# Patient Record
Sex: Female | Born: 1985 | Race: Black or African American | Hispanic: No | Marital: Single | Smoking: Never smoker
Health system: Southern US, Community
[De-identification: ages and names within clinical notes are randomized; demographics above are authoritative.]

## PROBLEM LIST (undated history)

## (undated) DIAGNOSIS — J45909 Unspecified asthma, uncomplicated: Secondary | ICD-10-CM

## (undated) DIAGNOSIS — D219 Benign neoplasm of connective and other soft tissue, unspecified: Secondary | ICD-10-CM

## (undated) HISTORY — PX: TOOTH EXTRACTION: SUR596

---

## 2009-09-30 ENCOUNTER — Emergency Department (HOSPITAL_COMMUNITY): Admission: EM | Admit: 2009-09-30 | Discharge: 2009-10-01 | Payer: Self-pay | Admitting: Emergency Medicine

## 2009-10-07 ENCOUNTER — Emergency Department (HOSPITAL_COMMUNITY): Admission: EM | Admit: 2009-10-07 | Discharge: 2009-10-07 | Payer: Self-pay | Admitting: Emergency Medicine

## 2010-09-29 ENCOUNTER — Emergency Department (HOSPITAL_COMMUNITY): Admission: EM | Admit: 2010-09-29 | Discharge: 2010-09-29 | Payer: Self-pay | Admitting: Emergency Medicine

## 2011-07-04 ENCOUNTER — Emergency Department (INDEPENDENT_AMBULATORY_CARE_PROVIDER_SITE_OTHER): Payer: No Typology Code available for payment source

## 2011-07-04 ENCOUNTER — Emergency Department (HOSPITAL_BASED_OUTPATIENT_CLINIC_OR_DEPARTMENT_OTHER)
Admission: EM | Admit: 2011-07-04 | Discharge: 2011-07-04 | Disposition: A | Discharge status: Home / Self Care | Payer: No Typology Code available for payment source | Attending: Emergency Medicine | Admitting: Emergency Medicine

## 2011-07-04 DIAGNOSIS — Q740 Other congenital malformations of upper limb(s), including shoulder girdle: Secondary | ICD-10-CM

## 2011-07-04 DIAGNOSIS — S6990XA Unspecified injury of unspecified wrist, hand and finger(s), initial encounter: Secondary | ICD-10-CM | POA: Insufficient documentation

## 2011-07-04 DIAGNOSIS — M549 Dorsalgia, unspecified: Secondary | ICD-10-CM | POA: Insufficient documentation

## 2011-07-04 DIAGNOSIS — T23029A Burn of unspecified degree of unspecified single finger (nail) except thumb, initial encounter: Secondary | ICD-10-CM

## 2011-07-04 DIAGNOSIS — Y92410 Unspecified street and highway as the place of occurrence of the external cause: Secondary | ICD-10-CM | POA: Insufficient documentation

## 2011-07-04 MED ORDER — IBUPROFEN 800 MG PO TABS
800.0000 mg | ORAL_TABLET | Freq: Once | ORAL | Status: AC
  Administered 2011-07-04: 800 mg via ORAL
  Filled 2011-07-04: qty 1

## 2011-07-04 MED ORDER — IBUPROFEN 800 MG PO TABS: 800.0000 mg | ORAL_TABLET | Freq: Three times a day (TID) | ORAL | Status: AC
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
## 2012-08-07 ENCOUNTER — Encounter (HOSPITAL_BASED_OUTPATIENT_CLINIC_OR_DEPARTMENT_OTHER): Payer: Self-pay | Admitting: *Deleted

## 2012-08-07 ENCOUNTER — Emergency Department (HOSPITAL_BASED_OUTPATIENT_CLINIC_OR_DEPARTMENT_OTHER): Payer: Self-pay

## 2012-08-07 ENCOUNTER — Emergency Department (HOSPITAL_BASED_OUTPATIENT_CLINIC_OR_DEPARTMENT_OTHER)
Admission: EM | Admit: 2012-08-07 | Discharge: 2012-08-07 | Disposition: A | Discharge status: Home / Self Care | Payer: Self-pay | Attending: Emergency Medicine | Admitting: Emergency Medicine

## 2012-08-07 DIAGNOSIS — Z79899 Other long term (current) drug therapy: Secondary | ICD-10-CM | POA: Insufficient documentation

## 2012-08-07 DIAGNOSIS — F17200 Nicotine dependence, unspecified, uncomplicated: Secondary | ICD-10-CM | POA: Insufficient documentation

## 2012-08-07 DIAGNOSIS — J45909 Unspecified asthma, uncomplicated: Secondary | ICD-10-CM | POA: Insufficient documentation

## 2012-08-07 DIAGNOSIS — N939 Abnormal uterine and vaginal bleeding, unspecified: Secondary | ICD-10-CM

## 2012-08-07 DIAGNOSIS — N898 Other specified noninflammatory disorders of vagina: Secondary | ICD-10-CM | POA: Insufficient documentation

## 2012-08-07 HISTORY — DX: Unspecified asthma, uncomplicated: J45.909

## 2012-08-07 LAB — URINE MICROSCOPIC-ADD ON

## 2012-08-07 LAB — URINALYSIS, ROUTINE W REFLEX MICROSCOPIC
Bilirubin Urine: NEGATIVE
Protein, ur: NEGATIVE mg/dL
Specific Gravity, Urine: 1.015 (ref 1.005–1.030)
Urobilinogen, UA: 0.2 mg/dL (ref 0.0–1.0)

## 2012-08-07 LAB — WET PREP, GENITAL
Trich, Wet Prep: NONE SEEN
Yeast Wet Prep HPF POC: NONE SEEN

## 2012-08-07 MED ORDER — MEDROXYPROGESTERONE ACETATE 5 MG PO TABS: 10.0000 mg | ORAL_TABLET | Freq: Every day | ORAL | Status: DC

## 2012-08-07 NOTE — ED Notes (Signed)
Attempted I&O x 2 unsuccessful.  Pt ambulatory to BR for urine specimen.  Pelvic cart set up at bedside.

## 2012-08-07 NOTE — ED Notes (Signed)
Patient transported to Ultrasound 

## 2012-08-07 NOTE — ED Notes (Signed)
Patient states she started her menstrual cycle on Jun 26, 2012 and has continued to have heavy bleeding since with large blood clots.  C/O lightheadedness today.

## 2012-08-07 NOTE — ED Provider Notes (Signed)
History     CSN: [id number]  Arrival date & time 08/07/12  1316   First MD Initiated Contact with Patient 08/07/12 1409      Chief Complaint  Patient presents with  . Vaginal Bleeding    (Consider location/radiation/quality/duration/timing/severity/associated sxs/prior treatment) Patient is a 26 y.o. female presenting with vaginal bleeding. The history is provided by the patient. No language interpreter was used.  Vaginal Bleeding This is a new problem. The current episode started today. The problem occurs constantly. The problem has been gradually worsening. Pertinent negatives include no abdominal pain. Nothing aggravates the symptoms. She has tried nothing for the symptoms.  Pt complains of vaginal bleeding for the past 6 weeks.   Pt reports no pain.  Pt does not have a gynecologist  Past Medical History  Diagnosis Date  . Asthma     History reviewed. No pertinent past surgical history.  No family history on file.  History  Substance Use Topics  . Smoking status: Current Some Day Smoker  . Smokeless tobacco: Not on file  . Alcohol Use: Yes    OB History    Grav Para Term Preterm Abortions TAB SAB Ect Mult Living                  Review of Systems  Gastrointestinal: Negative for abdominal pain.  Genitourinary: Positive for vaginal bleeding.  All other systems reviewed and are negative.    Allergies  Review of patient's allergies indicates no known allergies.  Home Medications   Current Outpatient Rx  Name Route Sig Dispense Refill  . DIPHENHYDRAMINE HCL 25 MG PO TABS Oral Take 25 mg by mouth every 6 (six) hours as needed. For allergies.      BP 146/76  Pulse 88  Temp 99 F (37.2 C) (Oral)  Resp 20  Ht 5\' 8"  (1.727 m)  Wt 265 lb (120.203 kg)  BMI 40.29 kg/m2  SpO2 100%  LMP 06/26/2012  Physical Exam  Nursing note and vitals reviewed. Constitutional: She appears well-developed and well-nourished.  HENT:  Head: Normocephalic and atraumatic.    Eyes: Pupils are equal, round, and reactive to light.  Neck: Normal range of motion.  Cardiovascular: Normal rate.   Pulmonary/Chest: Effort normal.  Abdominal: Soft. Bowel sounds are normal.  Genitourinary:       Moderate vaginal bleeding,  cevix and anexa nontender  Musculoskeletal: Normal range of motion.  Neurological: She is alert.  Skin: Skin is warm.    ED Course  Procedures (including critical care time)  Labs Reviewed  URINALYSIS, ROUTINE W REFLEX MICROSCOPIC - Abnormal; Notable for the following:    Hgb urine dipstick LARGE (*)     All other components within normal limits  WET PREP, GENITAL - Abnormal; Notable for the following:    Clue Cells Wet Prep HPF POC RARE (*)     WBC, Wet Prep HPF POC RARE (*)     All other components within normal limits  PREGNANCY, URINE  URINE MICROSCOPIC-ADD ON  GC/CHLAMYDIA PROBE AMP, GENITAL   US Transvaginal Non-ob  08/07/2012  *RADIOLOGY REPORT*  Clinical Data: Vaginal bleeding.  TRANSABDOMINAL AND TRANSVAGINAL ULTRASOUND OF PELVIS Technique:  Both transabdominal and transvaginal ultrasound examinations of the pelvis were performed. Transabdominal technique was performed for global imaging of the pelvis including uterus, ovaries, adnexal regions, and pelvic cul-de-sac.  It was necessary to proceed with endovaginal exam following the transabdominal exam to visualize the endometrium.  Comparison:  None  Findings:  Uterus: A  small fibroid is noted in the fundus measuring 1.5 x 1.3 x 1.4 cm.  Uterus is otherwise within normal limits, measuring 8.1 x 3.9 cm.  Endometrium: Endometrium is thickened, measuring 19 mm.  Right ovary:  The right ovary measures 3.5 x 2.7 x 3.4 cm.  A hypoechoic area within the ovary measures 2.9 cm.  Left ovary: Not identified.  Other findings: Trace free fluid is present.  IMPRESSION:  1.  Endometrial thickening. If bleeding remains unresponsive to hormonal or medical therapy, focal lesion work-up with sonohysterogram  should be considered.  Endometrial biopsy should also be considered in pre-menopausal patients at high risk for endometrial carcinoma. (Ref:  Radiological Reasoning: Algorithmic Workup of Abnormal Vaginal Bleeding with Endovaginal Sonography and Sonohysterography. AJR 2008; 161:W96-04) 2.  2.9 cm cystic area within the right ovary.  This likely represents a dominant follicle. 3.  The left ovary is not visualized.   Original Report Authenticated By: Jamesetta Orleans. MATTERN, M.D.    US Pelvis Complete  08/07/2012  *RADIOLOGY REPORT*  Clinical Data: Vaginal bleeding.  TRANSABDOMINAL AND TRANSVAGINAL ULTRASOUND OF PELVIS Technique:  Both transabdominal and transvaginal ultrasound examinations of the pelvis were performed. Transabdominal technique was performed for global imaging of the pelvis including uterus, ovaries, adnexal regions, and pelvic cul-de-sac.  It was necessary to proceed with endovaginal exam following the transabdominal exam to visualize the endometrium.  Comparison:  None  Findings:  Uterus: A small fibroid is noted in the fundus measuring 1.5 x 1.3 x 1.4 cm.  Uterus is otherwise within normal limits, measuring 8.1 x 3.9 cm.  Endometrium: Endometrium is thickened, measuring 19 mm.  Right ovary:  The right ovary measures 3.5 x 2.7 x 3.4 cm.  A hypoechoic area within the ovary measures 2.9 cm.  Left ovary: Not identified.  Other findings: Trace free fluid is present.  IMPRESSION:  1.  Endometrial thickening. If bleeding remains unresponsive to hormonal or medical therapy, focal lesion work-up with sonohysterogram should be considered.  Endometrial biopsy should also be considered in pre-menopausal patients at high risk for endometrial carcinoma. (Ref:  Radiological Reasoning: Algorithmic Workup of Abnormal Vaginal Bleeding with Endovaginal Sonography and Sonohysterography. AJR 2008; 540:J81-19) 2.  2.9 cm cystic area within the right ovary.  This likely represents a dominant follicle. 3.  The left  ovary is not visualized.   Original Report Authenticated By: Jamesetta Orleans. MATTERN, M.D.      1. Abnormal vaginal bleeding       MDM  I discussed results with pt.  I will treat with provera x 10 days.   Pt advised to call Gyn clinic to schedule appointment for follow up at [phone] or Dr. Guy Franco.          Lonia Skinner [place], [place] 08/07/12 [address] [place], [place] 08/07/12 [phone]

## 2012-08-08 LAB — GC/CHLAMYDIA PROBE AMP, GENITAL: GC Probe Amp, Genital: NEGATIVE

## 2012-08-08 NOTE — ED Provider Notes (Signed)
Medical screening examination/treatment/procedure(s) were performed by non-physician practitioner and as supervising physician I was immediately available for consultation/collaboration.  Anapaola Kinsel, MD 08/08/12 0702 

## 2012-08-15 ENCOUNTER — Encounter (HOSPITAL_COMMUNITY): Payer: Self-pay | Admitting: *Deleted

## 2012-08-15 ENCOUNTER — Inpatient Hospital Stay (HOSPITAL_COMMUNITY)
Admission: AD | Admit: 2012-08-15 | Discharge: 2012-08-16 | Disposition: A | Discharge status: Home / Self Care | Payer: Self-pay | Source: Ambulatory Visit | Attending: Obstetrics & Gynecology | Admitting: Obstetrics & Gynecology

## 2012-08-15 DIAGNOSIS — N949 Unspecified condition associated with female genital organs and menstrual cycle: Secondary | ICD-10-CM | POA: Insufficient documentation

## 2012-08-15 DIAGNOSIS — N898 Other specified noninflammatory disorders of vagina: Secondary | ICD-10-CM

## 2012-08-15 DIAGNOSIS — N939 Abnormal uterine and vaginal bleeding, unspecified: Secondary | ICD-10-CM

## 2012-08-15 DIAGNOSIS — N938 Other specified abnormal uterine and vaginal bleeding: Secondary | ICD-10-CM | POA: Insufficient documentation

## 2012-08-15 DIAGNOSIS — D649 Anemia, unspecified: Secondary | ICD-10-CM | POA: Insufficient documentation

## 2012-08-15 HISTORY — DX: Benign neoplasm of connective and other soft tissue, unspecified: D21.9

## 2012-08-15 LAB — CBC
Hemoglobin: 7.1 g/dL — ABNORMAL LOW (ref 12.0–15.0)
MCH: 22.8 pg — ABNORMAL LOW (ref 26.0–34.0)
MCHC: 30.6 g/dL (ref 30.0–36.0)

## 2012-08-15 MED ORDER — SODIUM CHLORIDE 0.9 % IV BOLUS (SEPSIS)
1000.0000 mL | Freq: Once | INTRAVENOUS | Status: AC
  Administered 2012-08-15: 1000 mL via INTRAVENOUS

## 2012-08-15 MED ORDER — SODIUM CHLORIDE 0.9 % IV SOLN
INTRAVENOUS | Status: AC
  Administered 2012-08-15: 22:00:00 via INTRAVENOUS

## 2012-08-15 MED ORDER — MEGESTROL ACETATE 40 MG PO TABS
40.0000 mg | ORAL_TABLET | Freq: Once | ORAL | Status: AC
  Administered 2012-08-15: 40 mg via ORAL
  Filled 2012-08-15: qty 1

## 2012-08-15 MED ORDER — MEGESTROL ACETATE 20 MG PO TABS: 40.0000 mg | ORAL_TABLET | Freq: Every day | ORAL | Status: DC

## 2012-08-15 MED ORDER — FERROUS SULFATE 325 (65 FE) MG PO TABS: 325.0000 mg | ORAL_TABLET | Freq: Every day | ORAL | Status: DC

## 2012-08-15 NOTE — MAU Note (Signed)
Pt states has been bleeding for 6 weeks; had regular periods until then. Has been feeling weak since last weekend and has had episodes where felt like was going to pass out. States has to change pad every 30 minutes to an hour.

## 2012-08-15 NOTE — MAU Note (Signed)
Pt reports bleeding for 2 months. Pt was seen at [facility] last week. Pt was given a birth control pill and was unable to f/u with gyn due financial barriers. Pt now bleeding again and changing pads every 30-60 minutes. Pt reports feeling weak and lightheaded.

## 2012-08-15 NOTE — MAU Provider Note (Signed)
History     CSN: [id number]  Arrival date and time: 08/15/12 1916   First Provider Initiated Contact with Patient 08/15/12 2112      Chief Complaint  Patient presents with  . Vaginal Bleeding   HPI Katie Bauer is a 26 y.o. female who presents to MAU with vaginal bleeding. The bleeding started end of August. She describes the bleeding as heavier than a period. Using a pad an hour for almost 2 months. Associated symptoms include headache, dizziness and fatigue. Last pap smear > 2 years ago and was normal. No history of STI's. Current sex partner x 6 years and is same sex. Patient states she was evaluated [facility]/17 and started on Provera but has continued to have heavy bleeding. Ultrasound at that time showed thick endometrium. No history of anemia. The history was provided by the patient.  OB History    Grav Para Term Preterm Abortions TAB SAB Ect Mult Living                  Past Medical History  Diagnosis Date  . Asthma   . Fibroid     Past Surgical History  Procedure Date  . Tooth extraction     Family History  Problem Relation Age of Onset  . Other Neg Hx     History  Substance Use Topics  . Smoking status: Current Some Day Smoker  . Smokeless tobacco: Not on file  . Alcohol Use: Yes    Allergies: No Known Allergies  Prescriptions prior to admission  Medication Sig Dispense Refill  . diphenhydrAMINE (SOBA DIPHEDRYL) 25 MG tablet Take 25 mg by mouth every 6 (six) hours as needed. For allergies.      . medroxyPROGESTERone (PROVERA) 5 MG tablet Take 2 tablets (10 mg total) by mouth daily.  10 tablet  0    Review of Systems  Constitutional: Negative for fever, chills and weight loss.  HENT: Negative for ear pain, nosebleeds, congestion, sore throat and neck pain.   Eyes: Negative for blurred vision, double vision, photophobia and pain.  Respiratory: Negative for cough, shortness of breath and wheezing.   Cardiovascular: Negative for chest  pain, palpitations and leg swelling.  Gastrointestinal: Positive for abdominal pain. Negative for heartburn, nausea, vomiting, diarrhea and constipation.  Genitourinary: Negative for dysuria, urgency and frequency.       Vaginal bleeding  Musculoskeletal: Positive for back pain. Negative for myalgias.  Skin: Negative for itching and rash.  Neurological: Positive for dizziness and headaches. Negative for sensory change, speech change, seizures and weakness.  Endo/Heme/Allergies: Does not bruise/bleed easily.  Psychiatric/Behavioral: Negative for depression. The patient is not nervous/anxious and does not have insomnia.    Physical Exam   Blood pressure 130/83, pulse 114, temperature 98.6 F (37 C), temperature source Oral, resp. rate 18, height 5' 7.5" (1.715 m), weight 262 lb 12.8 oz (119.205 kg), last menstrual period 06/26/2012, SpO2 100.00%.  Physical Exam  Nursing note and vitals reviewed. Constitutional: She is oriented to person, place, and time. She appears well-developed and well-nourished. No distress.  HENT:  Head: Normocephalic and atraumatic.  Eyes: EOM are normal.  Neck: Neck supple.  Cardiovascular:       tachycardia  Respiratory: Effort normal.  GI: Soft. Bowel sounds are normal. There is no tenderness. There is no CVA tenderness.  Genitourinary:       External genitalia without lesions. Moderate blood with clots vaginal vault. No CMT, no adnexal tenderness, Uterus  without palpable enlargement.  Musculoskeletal: Normal range of motion.  Neurological: She is alert and oriented to person, place, and time.  Skin: Skin is warm and dry.  Psychiatric: She has a normal mood and affect. Her behavior is normal. Judgment and thought content normal.   Results for orders placed during the hospital encounter of 08/15/12 (from the past 24 hour(s))  CBC     Status: Abnormal   Collection Time   08/15/12  8:44 PM      Component Value Range   WBC 7.6  4.0 - 10.5 K/uL   RBC 3.11 (*)  3.87 - 5.11 MIL/uL   Hemoglobin 7.1 (*) 12.0 - 15.0 g/dL   HCT 16.1 (*) 09.6 - 04.5 %   MCV 74.6 (*) 78.0 - 100.0 fL   MCH 22.8 (*) 26.0 - 34.0 pg   MCHC 30.6  30.0 - 36.0 g/dL   RDW 40.9  81.1 - 91.4 %   Platelets 335  150 - 400 K/uL   MAU Course  Procedures Ultrasound from 08/07/12   TRANSABDOMINAL AND TRANSVAGINAL ULTRASOUND OF PELVIS Technique:  Both transabdominal and transvaginal ultrasound examinations of the pelvis were performed. Transabdominal technique was performed for global imaging of the pelvis including uterus, ovaries, adnexal regions, and pelvic cul-de-sac.   It was necessary to proceed with endovaginal exam following the transabdominal exam to visualize the endometrium.   Comparison:  None   Findings:   Uterus: A small fibroid is noted in the fundus measuring 1.5 x 1.3 x 1.4 cm.  Uterus is otherwise within normal limits, measuring 8.1 x 3.9 cm.   Endometrium: Endometrium is thickened, measuring 19 mm.   Right ovary:  The right ovary measures 3.5 x 2.7 x 3.4 cm.  A hypoechoic area within the ovary measures 2.9 cm.   Left ovary: Not identified.   Other findings: Trace free fluid is present.   IMPRESSION:   1.  Endometrial thickening. If bleeding remains unresponsive to hormonal or medical therapy, focal lesion work-up with sonohysterogram should be considered.  Endometrial biopsy should also be considered in pre-menopausal patients at high risk for endometrial carcinoma. (Ref:  Radiological Reasoning: Algorithmic Workup of Abnormal Vaginal Bleeding with Endovaginal Sonography and Sonohysterography. AJR 2008; 782:N56-21) 2.  2.9 cm cystic area within the right ovary.  This likely represents a dominant follicle. 3.  The left ovary is not visualized.     Original Report Authenticated By: Jamesetta Orleans. MATTERN, M.D.     Re evaluation: patient states she is feeling a lot better after second bag of IV fluids.  Exam: Heart regular rate and rhythm,  lungs clear, abdomen soft, non tender, normal bowel sounds,   Assessment: 26 y.o. female with abnormal vaginal bleeding   Anemia  Plan:  Discussed with Dr. Florentina Addison two tablets (40 mg) three times per day for five days,  then take two tablets (40 mg) two times per day for five days  then take two tablets (40 mg)once per day   Fe   Follow up in GYN Clinic, message sent to clinic to call patient. Discussed with the patient and all questioned fully answered. She will return if any problems arise.  Keisha Amer, RN, FNP, [facility] 08/15/2012, 9:12 PM

## 2012-09-06 ENCOUNTER — Ambulatory Visit (INDEPENDENT_AMBULATORY_CARE_PROVIDER_SITE_OTHER): Payer: Self-pay | Admitting: Obstetrics & Gynecology

## 2012-09-06 VITALS — BP 141/82 | HR 96 | Temp 98.6°F | Resp 12 | Ht 66.0 in | Wt 256.6 lb

## 2012-09-06 DIAGNOSIS — N920 Excessive and frequent menstruation with regular cycle: Secondary | ICD-10-CM

## 2012-09-06 DIAGNOSIS — D649 Anemia, unspecified: Secondary | ICD-10-CM

## 2012-09-06 DIAGNOSIS — N970 Female infertility associated with anovulation: Secondary | ICD-10-CM

## 2012-09-06 LAB — LIPID PANEL
LDL Cholesterol: 73 mg/dL (ref 0–99)
Total CHOL/HDL Ratio: 3.9 Ratio
VLDL: 16 mg/dL (ref 0–40)

## 2012-09-06 LAB — FOLLICLE STIMULATING HORMONE: FSH: 6.6 m[IU]/mL

## 2012-09-06 LAB — LUTEINIZING HORMONE: LH: 1.6 m[IU]/mL

## 2012-09-06 LAB — TSH: TSH: 3.097 u[IU]/mL (ref 0.350–4.500)

## 2012-09-06 MED ORDER — NORETHIN-ETH ESTRADIOL-FE 0.4-35 MG-MCG PO CHEW: 1.0000 | CHEWABLE_TABLET | Freq: Every day | ORAL | Status: DC

## 2012-09-06 NOTE — Progress Notes (Signed)
Subjective:     Patient ID: Katie Bauer, female   DOB: 1986/06/29, 26 y.o.   MRN: [id number]  HPI  Pt with bleeding vaginally for greater than 4 weeks.  Pt was seen in the MAU and given Megace.  She reports that her bleeding has almost decreased since beginning the Megace. Pt has gained a considerably amount of weight.  Eats lots of junk food and does not exercise.  Pt c/o excessive hair growth. Review of Systems     Objective:   Physical ExamBP 141/82  Pulse 96  Temp 98.6 F (37 C) (Oral)  Resp 12  Ht 5\' 6"  (1.676 m)  Wt 256 lb 9.6 oz (116.393 kg)  BMI 41.42 kg/m2  LMP 06/27/2012 HEENT: neck acanthosis nigricans Lungs: CTA CV: RRR Abd: obese; NT; ND; excessive hair on abd GU: EGBUS: no lesions Vagina: no blood in vault Cervix: no lesion; no mucopurulent d/c Uterus: small, mobile Adnexa: no masses; non tender      Recent Results (from the past 1008 hour(s))  URINALYSIS, ROUTINE W REFLEX MICROSCOPIC   Collection Time   08/07/12  2:20 PM      Component Value Range   Color, Urine YELLOW  YELLOW   APPearance CLEAR  CLEAR   Specific Gravity, Urine 1.015  1.005 - 1.030   pH 6.5  5.0 - 8.0   Glucose, UA NEGATIVE  NEGATIVE mg/dL   Hgb urine dipstick LARGE (*) NEGATIVE   Bilirubin Urine NEGATIVE  NEGATIVE   Ketones, ur NEGATIVE  NEGATIVE mg/dL   Protein, ur NEGATIVE  NEGATIVE mg/dL   Urobilinogen, UA 0.2  0.0 - 1.0 mg/dL   Nitrite NEGATIVE  NEGATIVE   Leukocytes, UA NEGATIVE  NEGATIVE  PREGNANCY, URINE   Collection Time   08/07/12  2:20 PM      Component Value Range   Preg Test, Ur NEGATIVE  NEGATIVE  URINE MICROSCOPIC-ADD ON   Collection Time   08/07/12  2:20 PM      Component Value Range   Squamous Epithelial / LPF RARE  RARE   RBC / HPF 11-20  <3 RBC/hpf   Bacteria, UA RARE  RARE  WET PREP, GENITAL   Collection Time   08/07/12  3:02 PM      Component Value Range   Yeast Wet Prep HPF POC NONE SEEN  NONE SEEN   Trich, Wet Prep NONE SEEN  NONE SEEN   Clue Cells  Wet Prep HPF POC RARE (*) NONE SEEN   WBC, Wet Prep HPF POC RARE (*) NONE SEEN  GC/CHLAMYDIA PROBE AMP, GENITAL   Collection Time   08/07/12  3:02 PM      Component Value Range   GC Probe Amp, Genital NEGATIVE  NEGATIVE   Chlamydia, DNA Probe NEGATIVE  NEGATIVE  CBC   Collection Time   08/15/12  8:44 PM      Component Value Range   WBC 7.6  4.0 - 10.5 K/uL   RBC 3.11 (*) 3.87 - 5.11 MIL/uL   Hemoglobin 7.1 (*) 12.0 - 15.0 g/dL   HCT 30.8 (*) 65.7 - 84.6 %   MCV 74.6 (*) 78.0 - 100.0 fL   MCH 22.8 (*) 26.0 - 34.0 pg   MCHC 30.6  30.0 - 36.0 g/dL   RDW 96.2  95.2 - 84.1 %   Platelets 335  150 - 400 K/uL      sono 08/07/12 Findings:  Uterus: A small fibroid is noted in the fundus measuring 1.5 x  1.3  x 1.4 cm. Uterus is otherwise within normal limits, measuring 8.1  x 3.9 cm.  Endometrium: Endometrium is thickened, measuring 19 mm.  Right ovary: The right ovary measures 3.5 x 2.7 x 3.4 cm. A  hypoechoic area within the ovary measures 2.9 cm.  Left ovary: Not identified.  Other findings: Trace free fluid is present.  IMPRESSION:  1. Endometrial thickening. If bleeding remains unresponsive to  hormonal or medical therapy, focal lesion work-up with  sonohysterogram should be considered. Endometrial biopsy should  also be considered in pre-menopausal patients at high risk for  endometrial carcinoma. (Ref: Radiological Reasoning: Algorithmic  Workup of Abnormal Vaginal Bleeding with Endovaginal Sonography and  Sonohysterography. AJR 2008; 161:W96-04)  2. 2.9 cm cystic area within the right ovary. This likely  represents a dominant follicle.  3. The left ovary is not visualized.       Assessment:     Anovulatory cycles- suspect PCOS D/W pt increased risk of DM; increased chol and HTN Obesity- d/w pt diet and exercise and healthier eating.      Plan:     FemCon 1 po q day Labs: hbA1C; CBC; Lipid panel; FSH/LH; TSH F/u 3months Exercise 5x/week for >37min Recommend  Weight Watchers   Kristine Chahal L. Harraway-Smith, M.D., [place]

## 2012-09-06 NOTE — Patient Instructions (Signed)
Polycystic Ovarian Syndrome Polycystic ovarian syndrome is a condition with a number of problems. One problem is with the ovaries. The ovaries are organs located in the female pelvis, on each side of the uterus. Usually, during the menstrual cycle, an egg is released from 1 ovary every month. This is called ovulation. When the egg is fertilized, it goes into the womb (uterus), which allows for the growth of a baby. The egg travels from the ovary through the fallopian tube to the uterus. The ovaries also make the hormones estrogen and progesterone. These hormones help the development of a woman's breasts, body shape, and body hair. They also regulate the menstrual cycle and pregnancy. Sometimes, cysts form in the ovaries. A cyst is a fluid-filled sac. On the ovary, different types of cysts can form. The most common type of ovarian cyst is called a functional or ovulation cyst. It is normal, and often forms during the normal menstrual cycle. Each month, a woman's ovaries grow tiny cysts that hold the eggs. When an egg is fully grown, the sac breaks open. This releases the egg. Then, the sac which released the egg from the ovary dissolves. In one type of functional cyst, called a follicle cyst, the sac does not break open to release the egg. It may actually continue to grow. This type of cyst usually disappears within 1 to 3 months.  One type of cyst problem with the ovaries is called Polycystic Ovarian Syndrome (PCOS). In this condition, many follicle cysts form, but do not rupture and produce an egg. This health problem can affect the following:  Menstrual cycle.  Heart.  Obesity.  Cancer of the uterus.  Fertility.  Blood vessels.  Hair growth (face and body) or baldness.  Hormones.  Appearance.  High blood pressure.  Stroke.  Insulin production.  Inflammation of the liver.  Elevated blood cholesterol and triglycerides. CAUSES   No one knows the exact cause of PCOS.  Women with  PCOS often have a mother or sister with PCOS. There is not yet enough proof to say this is inherited.  Many women with PCOS have a weight problem.  Researchers are looking at the relationship between PCOS and the body's ability to make insulin. Insulin is a hormone that regulates the change of sugar, starches, and other food into energy for the body's use, or for storage. Some women with PCOS make too much insulin. It is possible that the ovaries react by making too many female hormones, called androgens. This can lead to acne, excessive hair growth, weight gain, and ovulation problems.  Too much production of luteinizing hormone (LH) from the pituitary gland in the brain stimulates the ovary to produce too much female hormone (androgen). SYMPTOMS   Infrequent or no menstrual periods, and/or irregular bleeding.  Inability to get pregnant (infertility), because of not ovulating.  Increased growth of hair on the face, chest, stomach, back, thumbs, thighs, or toes.  Acne, oily skin, or dandruff.  Pelvic pain.  Weight gain or obesity, usually carrying extra weight around the waist.  Type 2 diabetes (this is the diabetes that usually does not need insulin).  High cholesterol.  High blood pressure.  Female-pattern baldness or thinning hair.  Patches of thickened and dark brown or black skin on the neck, arms, breasts, or thighs.  Skin tags, or tiny excess flaps of skin, in the armpits or neck area.  Sleep apnea (excessive snoring and breathing stops at times while asleep).  Deepening of the voice.  Gestational diabetes when pregnant.  Increased risk of miscarriage with pregnancy. DIAGNOSIS  There is no single test to diagnose PCOS.   Your caregiver will:  Take a medical history.  Perform a pelvic exam.  Perform an ultrasound.  Check your female and female hormone levels.  Measure glucose or sugar levels in the blood.  Do other blood tests.  If you are producing too many  female hormones, your caregiver will make sure it is from PCOS. At the physical exam, your caregiver will want to evaluate the areas of increased hair growth. Try to allow natural hair growth for a few days before the visit.  During a pelvic exam, the ovaries may be enlarged or swollen by the increased number of small cysts. This can be seen more easily by vaginal ultrasound or screening, to examine the ovaries and lining of the uterus (endometrium) for cysts. The uterine lining may become thicker, if there has not been a regular period. TREATMENT  Because there is no cure for PCOS, it needs to be managed to prevent problems. Treatments are based on your symptoms. Treatment is also based on whether you want to have a baby or whether you need contraception.  Treatment may include:  Progesterone hormone, to start a menstrual period.  Birth control pills, to make you have regular menstrual periods.  Medicines to make you ovulate, if you want to get pregnant.  Medicines to control your insulin.  Medicine to control your blood pressure.  Medicine and diet, to control your high cholesterol and triglycerides in your blood.  Surgery, making small holes in the ovary, to decrease the amount of female hormone production. This is done through a long, lighted tube (laparoscope), placed into the pelvis through a tiny incision in the lower abdomen. Your caregiver will go over some of the choices with you. WOMEN WITH PCOS HAVE THESE CHARACTERISTICS:  High levels of female hormones called androgens.  An irregular or no menstrual cycle.  May have many small cysts in their ovaries. PCOS is the most common hormonal reproductive problem in women of childbearing age. WHY DO WOMEN WITH PCOS HAVE TROUBLE WITH THEIR MENSTRUAL CYCLE? Each month, about 20 eggs start to mature in the ovaries. As one egg grows and matures, the follicle breaks open to release the egg, so it can travel through the fallopian tube for  fertilization. When the single egg leaves the follicle, ovulation takes place. In women with PCOS, the ovary does not make all of the hormones it needs for any of the eggs to fully mature. They may start to grow and accumulate fluid, but no one egg becomes large enough. Instead, some may remain as cysts. Since no egg matures or is released, ovulation does not occur and the hormone progesterone is not made. Without progesterone, a woman's menstrual cycle is irregular or absent. Also, the cysts produce female hormones, which continue to prevent ovulation.  Document Released: 03/03/2005 Document Revised: 01/30/2012 Document Reviewed: 09/25/2009 [facility] Patient Information 2013 [place], [place]. Menorrhagia Dysfunctional uterine bleeding is different from a normal menstrual period. When periods are heavy or there is more bleeding than is usual for you, it is called menorrhagia. It may be caused by hormonal imbalance, or physical, metabolic, or other problems. Examination is necessary in order that your caregiver may treat treatable causes. If this is a continuing problem, a D&C may be needed. That means that the cervix (the opening of the uterus or womb) is dilated (stretched larger) and the lining of  the uterus is scraped out. The tissue scraped out is then examined under a microscope by a specialist (pathologist) to make sure there is nothing of concern that needs further or more extensive treatment. HOME CARE INSTRUCTIONS   If medications were prescribed, take exactly as directed. Do not change or switch medications without consulting your caregiver.  Long term heavy bleeding may result in iron deficiency. Your caregiver may have prescribed iron pills. They help replace the iron your body lost from heavy bleeding. Take exactly as directed. Iron may cause constipation. If this becomes a problem, increase the bran, fruits, and roughage in your diet.  Do not take aspirin or medicines that contain aspirin one  week before or during your menstrual period. Aspirin may make the bleeding worse.  If you need to change your sanitary pad or tampon more than once every 2 hours, stay in bed and rest as much as possible until the bleeding stops.  Eat well-balanced meals. Eat foods high in iron. Examples are leafy green vegetables, meat, liver, eggs, and whole grain breads and cereals. Do not try to lose weight until the abnormal bleeding has stopped and your blood iron level is back to normal. SEEK MEDICAL CARE IF:   You need to change your sanitary pad or tampon more than once an hour.  You develop nausea (feeling sick to your stomach) and vomiting, dizziness, or diarrhea while you are taking your medicine.  You have any problems that may be related to the medicine you are taking. SEEK IMMEDIATE MEDICAL CARE IF:   You have a fever.  You develop chills.  You develop severe bleeding or start to pass blood clots.  You feel dizzy or faint. MAKE SURE YOU:   Understand these instructions.  Will watch your condition.  Will get help right away if you are not doing well or get worse. Document Released: 11/07/2005 Document Revised: 01/30/2012 Document Reviewed: 06/27/2008 [facility] Patient Information 2013 [place], [place].

## 2012-09-07 LAB — CBC
HCT: 32.4 % — ABNORMAL LOW (ref 36.0–46.0)
MCH: 21.7 pg — ABNORMAL LOW (ref 26.0–34.0)
MCHC: 29.3 g/dL — ABNORMAL LOW (ref 30.0–36.0)
MCV: 74.1 fL — ABNORMAL LOW (ref 78.0–100.0)
RDW: 15.9 % — ABNORMAL HIGH (ref 11.5–15.5)

## 2012-09-10 ENCOUNTER — Telehealth: Payer: Self-pay | Admitting: *Deleted

## 2012-09-10 NOTE — Telephone Encounter (Signed)
Message copied by Jill Side on Mon Sep 10, 2012 11:51 AM ------      Message from: Katie Bauer      Created: Fri Sep 07, 2012  1:06 PM       Please call pt and let her know that her Hct is much better but, that she still needs to take the OCP's and get the weight off as we discussed.            Please confirm that pt has started exercise plan and has contacted [employer].            Thx,      clh-S

## 2012-09-10 NOTE — Telephone Encounter (Signed)
Called pt and left message that I am calling with test result and information from Dr. Erin Fulling. Please call back and leave a message of when you may be reached. If you prefer, you may state if it is ok to leave detailed personal medical information on your voice mail.

## 2012-09-11 NOTE — Telephone Encounter (Signed)
Pt returned my call and I informed her of her test results and that Dr. Erin Fulling wants her to continue the OCP's. I also asked if she had contacted [employer] [facility] and started an exercise plan. She stated that [facility] is out of her price range but she had found an app on her phone called "[employer]" which she has been using since her visit. She also states that she goes to the gym several times weekly for atleast 30 min.  I praised pt for following the recommendations made by Dr. Erin Fulling and encouraged her to continue. Pt will call back in December for January 2014 appt as advised.

## 2012-09-24 ENCOUNTER — Telehealth: Payer: Self-pay | Admitting: *Deleted

## 2012-09-26 ENCOUNTER — Telehealth: Payer: Self-pay | Admitting: Medical

## 2012-09-26 ENCOUNTER — Other Ambulatory Visit: Payer: Self-pay | Admitting: Obstetrics and Gynecology

## 2012-09-26 MED ORDER — NORGESTIM-ETH ESTRAD TRIPHASIC 0.18/0.215/0.25 MG-25 MCG PO TABS: 1.0000 | ORAL_TABLET | Freq: Every day | ORAL | Status: DC

## 2012-09-26 NOTE — Telephone Encounter (Signed)
Called pt and pt stated that her BCP that she was prescribed at her last visit is making her sick.  Spoke to [facility] and per Dr. Jolayne Panther she would like for the pt to try taking them at night first and if she continues to not be able to tolerate it then there is a lower dose BCP prescribed for her at her pharmacy.  Pt stated understanding and had no further questions.

## 2012-09-26 NOTE — Telephone Encounter (Signed)
Patient called stating that her medication prescribed at last visit has made her sick. Would like something different called in.

## 2012-10-11 NOTE — Telephone Encounter (Signed)
patient unable to take prescription due to N/V and wants to know if she can have a different RX. Can leave message if she does not answer.

## 2012-10-16 ENCOUNTER — Telehealth: Payer: Self-pay | Admitting: *Deleted

## 2012-10-16 DIAGNOSIS — D649 Anemia, unspecified: Secondary | ICD-10-CM

## 2012-10-16 DIAGNOSIS — N970 Female infertility associated with anovulation: Secondary | ICD-10-CM

## 2012-10-16 MED ORDER — FERROUS SULFATE 325 (65 FE) MG PO TABS: 325.0000 mg | ORAL_TABLET | Freq: Every day | ORAL | Status: DC

## 2012-10-16 MED ORDER — NORETHIN-ETH ESTRAD TRIPHASIC 0.5/0.75/1-35 MG-MCG PO TABS: 1.0000 | ORAL_TABLET | Freq: Every day | ORAL | Status: DC

## 2012-10-16 NOTE — Telephone Encounter (Signed)
Orders sent to [facility] outpatient pharmacy. Patient informed that Rx should be ready later today. Patient had no further questions or concerns.

## 2012-10-16 NOTE — Telephone Encounter (Signed)
Pt left message requesting Rx refill of "the first medicine you gave me- it is working well but I need a generic because it is too expensive."

## 2012-10-16 NOTE — Telephone Encounter (Signed)
Called [facility] outpatient pharmacy to discuss pricing for comparable OCPs. The pharmacist looked into comparable options and returned my call. Her suggestions was Nortrel 0.5-35 for $30/month and a separate iron supplement which would be $5/month. I called the patient to ask her if she thought this would be affordable and she said yes. I will confirm that this change is ok with the provider on call as Dr. Erin Fulling is off until next Tuesday and the patient needs the refill by tomorrow.

## 2012-10-16 NOTE — Telephone Encounter (Signed)
Spoke with patient. She has been taking the [place] and feels as though it is working well for her, however she needs a "generic" because this medication is costing $65/month.

## 2012-10-16 NOTE — Telephone Encounter (Signed)
Discussed patient with Dr. Debroah Loop. He is ok with switching patient to the Nortrel and prescribing an iron supplement. I will send the new Rx to the pharmacy today and inform the patient that it will be available for pickup later today.

## 2013-01-05 ENCOUNTER — Other Ambulatory Visit: Payer: Self-pay

## 2013-09-26 ENCOUNTER — Other Ambulatory Visit: Payer: Self-pay

## 2013-11-16 ENCOUNTER — Emergency Department (HOSPITAL_COMMUNITY)
Admission: EM | Admit: 2013-11-16 | Discharge: 2013-11-17 | Disposition: A | Discharge status: Home / Self Care | Payer: Self-pay | Attending: Emergency Medicine | Admitting: Emergency Medicine

## 2013-11-16 ENCOUNTER — Encounter (HOSPITAL_COMMUNITY): Payer: Self-pay | Admitting: Emergency Medicine

## 2013-11-16 DIAGNOSIS — Z8742 Personal history of other diseases of the female genital tract: Secondary | ICD-10-CM | POA: Insufficient documentation

## 2013-11-16 DIAGNOSIS — J45909 Unspecified asthma, uncomplicated: Secondary | ICD-10-CM | POA: Insufficient documentation

## 2013-11-16 DIAGNOSIS — R6889 Other general symptoms and signs: Secondary | ICD-10-CM

## 2013-11-16 DIAGNOSIS — R079 Chest pain, unspecified: Secondary | ICD-10-CM | POA: Insufficient documentation

## 2013-11-16 DIAGNOSIS — J111 Influenza due to unidentified influenza virus with other respiratory manifestations: Secondary | ICD-10-CM | POA: Insufficient documentation

## 2013-11-16 DIAGNOSIS — Z3202 Encounter for pregnancy test, result negative: Secondary | ICD-10-CM | POA: Insufficient documentation

## 2013-11-16 DIAGNOSIS — F17200 Nicotine dependence, unspecified, uncomplicated: Secondary | ICD-10-CM | POA: Insufficient documentation

## 2013-11-16 LAB — CBC WITH DIFFERENTIAL/PLATELET
Basophils Absolute: 0 10*3/uL (ref 0.0–0.1)
Eosinophils Absolute: 0.2 10*3/uL (ref 0.0–0.7)
HCT: 37.4 % (ref 36.0–46.0)
Lymphs Abs: 1.9 10*3/uL (ref 0.7–4.0)
MCHC: 31.3 g/dL (ref 30.0–36.0)
MCV: 78.1 fL (ref 78.0–100.0)
Monocytes Relative: 11 % (ref 3–12)
Neutro Abs: 0.8 10*3/uL — ABNORMAL LOW (ref 1.7–7.7)
Neutrophils Relative %: 25 % — ABNORMAL LOW (ref 43–77)
Platelets: 295 10*3/uL (ref 150–400)
RDW: 15.1 % (ref 11.5–15.5)
WBC: 3.3 10*3/uL — ABNORMAL LOW (ref 4.0–10.5)

## 2013-11-16 LAB — COMPREHENSIVE METABOLIC PANEL
AST: 26 U/L (ref 0–37)
Albumin: 3.4 g/dL — ABNORMAL LOW (ref 3.5–5.2)
BUN: 5 mg/dL — ABNORMAL LOW (ref 6–23)
Calcium: 9.2 mg/dL (ref 8.4–10.5)
Creatinine, Ser: 0.7 mg/dL (ref 0.50–1.10)
GFR calc Af Amer: >90 mL/min (ref >90)
Glucose, Bld: 100 mg/dL — ABNORMAL HIGH (ref 70–99)
Sodium: 140 mEq/L (ref 135–145)
Total Bilirubin: 0.2 mg/dL — ABNORMAL LOW (ref 0.3–1.2)
Total Protein: 7.3 g/dL (ref 6.0–8.3)

## 2013-11-16 LAB — LIPASE, BLOOD: Lipase: 25 U/L (ref 11–59)

## 2013-11-16 NOTE — ED Notes (Signed)
Pt c/o emesis x 2 days, unable to hold down fluids. +cough and body aches.

## 2013-11-17 ENCOUNTER — Emergency Department (HOSPITAL_COMMUNITY): Payer: Self-pay

## 2013-11-17 LAB — POCT PREGNANCY, URINE: Preg Test, Ur: NEGATIVE

## 2013-11-17 LAB — URINALYSIS, ROUTINE W REFLEX MICROSCOPIC
Bilirubin Urine: NEGATIVE
Ketones, ur: NEGATIVE mg/dL
Leukocytes, UA: NEGATIVE
Nitrite: NEGATIVE
Specific Gravity, Urine: 1.024 (ref 1.005–1.030)
Urobilinogen, UA: 1 mg/dL (ref 0.0–1.0)
pH: 6.5 (ref 5.0–8.0)

## 2013-11-17 MED ORDER — ONDANSETRON HCL 4 MG PO TABS: 4.0000 mg | ORAL_TABLET | Freq: Four times a day (QID) | ORAL | Status: DC | PRN

## 2013-11-17 MED ORDER — SODIUM CHLORIDE 0.9 % IV BOLUS (SEPSIS)
1000.0000 mL | Freq: Once | INTRAVENOUS | Status: AC
  Administered 2013-11-17: 1000 mL via INTRAVENOUS

## 2013-11-17 NOTE — ED Provider Notes (Signed)
CSN: [id number]     Arrival date & time 11/16/13  2111 History   First MD Initiated Contact with Patient 11/17/13 0034     Chief Complaint  Patient presents with  . Emesis   (Consider location/radiation/quality/duration/timing/severity/associated sxs/prior Treatment) HPI Comments: 27 year old female presents with cough and vomiting. She states she's been having cough, congestion, and some aches for the past week. She's been having fevers as well. These are subjective. She has intermittent headaches does not have a headache currently. She's also been nauseous and has not been taking in much fluid because she vomits. She's not have any abdominal pain. She states she did have a little bit of chest pain when she coughs. She states she's coughing up a green and brown sputum. Denies any shortness of breath. She has had multiple coworkers have been sick with similar symptoms. Has had a little bit of a sore throat as well.   Past Medical History  Diagnosis Date  . Asthma   . Fibroid    Past Surgical History  Procedure Laterality Date  . Tooth extraction     Family History  Problem Relation Age of Onset  . Other Neg Hx    History  Substance Use Topics  . Smoking status: Current Some Day Smoker  . Smokeless tobacco: Not on file  . Alcohol Use: Yes     Comment: occ   OB History   Grav Para Term Preterm Abortions TAB SAB Ect Mult Living                 Review of Systems  Constitutional: Positive for fever.  HENT: Positive for congestion and sore throat.   Respiratory: Positive for cough. Negative for shortness of breath.   Cardiovascular: Positive for chest pain.  Gastrointestinal: Positive for nausea and vomiting. Negative for abdominal pain.  Genitourinary: Negative for dysuria and menstrual problem.  Neurological: Positive for headaches. Negative for weakness.  All other systems reviewed and are negative.    Allergies  Review of patient's allergies indicates no known  allergies.  Home Medications  No current outpatient prescriptions on file. BP 137/90  Pulse 100  Temp(Src) 98.6 F (37 C) (Oral)  Resp 18  Wt 219 lb (99.338 kg)  SpO2 100%  LMP 11/08/2013 Physical Exam  Nursing note and vitals reviewed. Constitutional: She is oriented to person, place, and time. She appears well-developed and well-nourished.  HENT:  Head: Normocephalic and atraumatic.  Right Ear: External ear normal.  Left Ear: External ear normal.  Nose: Nose normal.  Mouth/Throat: No oropharyngeal exudate.  Eyes: Right eye exhibits no discharge. Left eye exhibits no discharge.  Neck: Normal range of motion. Neck supple.  Cardiovascular: Normal rate, regular rhythm and normal heart sounds.   Pulmonary/Chest: Effort normal and breath sounds normal. She has no wheezes. She has no rales.  Abdominal: Soft. She exhibits no distension. There is no tenderness.  Lymphadenopathy:    She has no cervical adenopathy.  Neurological: She is alert and oriented to person, place, and time.  Skin: Skin is warm and dry.    ED Course  Procedures (including critical care time) Labs Review Labs Reviewed  CBC WITH DIFFERENTIAL - Abnormal; Notable for the following:    WBC 3.3 (*)    Hemoglobin 11.7 (*)    MCH 24.4 (*)    Neutrophils Relative % 25 (*)    Lymphocytes Relative 57 (*)    Eosinophils Relative 6 (*)    Neutro Abs 0.8 (*)  All other components within normal limits  COMPREHENSIVE METABOLIC PANEL - Abnormal; Notable for the following:    Glucose, Bld 100 (*)    BUN 5 (*)    Albumin 3.4 (*)    Total Bilirubin 0.2 (*)    All other components within normal limits  LIPASE, BLOOD  URINALYSIS, ROUTINE W REFLEX MICROSCOPIC  PATHOLOGIST SMEAR REVIEW   Imaging Review Dg Chest 2 View  11/17/2013   CLINICAL DATA:  Cough, chest pain  EXAM: CHEST  2 VIEW  COMPARISON:  None.  FINDINGS: The heart size and mediastinal contours are within normal limits. Both lungs are clear. The  visualized skeletal structures are unremarkable.  IMPRESSION: No active cardiopulmonary disease.   Electronically Signed   By: Ruel Favors M.D.   On: 11/17/2013 01:12    EKG Interpretation    Date/Time:  Sunday November 17 2013 02:11:29 EST Ventricular Rate:  62 PR Interval:  156 QRS Duration: 91 QT Interval:  406 QTC Calculation: 412 R Axis:   74 Text Interpretation:  Sinus rhythm Normal ECG No significant change since last tracing Confirmed by Diandre Merica  MD, Dacian Orrico ([id number]) on 11/17/2013 9:13:54 AM            MDM   1. Flu-like symptoms    Patient with flu-like symptoms with vomiting. No vomiting here. Able to tolerate PO after given zofran. No signs of PNA. No tachypnea or respiratory distress. CP likely from coughing, has benign ECG and has viral symptoms. Will d/c with zofran, discussed supportive care and return precautions.     Audree Camel, MD 11/17/13 [phone]

## 2013-11-17 NOTE — ED Notes (Signed)
Pt stated that she is not able to urinate.   Will try again in 30 minutes. 

## 2013-11-17 NOTE — ED Notes (Signed)
Patient transported to X-ray 

## 2013-11-18 LAB — PATHOLOGIST SMEAR REVIEW

## 2015-07-08 ENCOUNTER — Emergency Department (HOSPITAL_BASED_OUTPATIENT_CLINIC_OR_DEPARTMENT_OTHER): Payer: Self-pay

## 2015-07-08 ENCOUNTER — Encounter (HOSPITAL_BASED_OUTPATIENT_CLINIC_OR_DEPARTMENT_OTHER): Payer: Self-pay | Admitting: Emergency Medicine

## 2015-07-08 ENCOUNTER — Emergency Department (HOSPITAL_BASED_OUTPATIENT_CLINIC_OR_DEPARTMENT_OTHER)
Admission: EM | Admit: 2015-07-08 | Discharge: 2015-07-08 | Disposition: A | Discharge status: Home / Self Care | Payer: Self-pay | Attending: Emergency Medicine | Admitting: Emergency Medicine

## 2015-07-08 DIAGNOSIS — Z86018 Personal history of other benign neoplasm: Secondary | ICD-10-CM | POA: Insufficient documentation

## 2015-07-08 DIAGNOSIS — J45909 Unspecified asthma, uncomplicated: Secondary | ICD-10-CM | POA: Insufficient documentation

## 2015-07-08 DIAGNOSIS — R0789 Other chest pain: Secondary | ICD-10-CM | POA: Insufficient documentation

## 2015-07-08 MED ORDER — METHOCARBAMOL 500 MG PO TABS: 500.0000 mg | ORAL_TABLET | Freq: Two times a day (BID) | ORAL | Status: DC

## 2015-07-08 MED ORDER — NAPROXEN 500 MG PO TABS: 500.0000 mg | ORAL_TABLET | Freq: Two times a day (BID) | ORAL | Status: DC

## 2015-07-08 MED ORDER — CYCLOBENZAPRINE HCL 10 MG PO TABS
5.0000 mg | ORAL_TABLET | Freq: Once | ORAL | Status: AC
  Administered 2015-07-08: 5 mg via ORAL
  Filled 2015-07-08: qty 1

## 2015-07-08 MED ORDER — NAPROXEN 250 MG PO TABS
500.0000 mg | ORAL_TABLET | Freq: Once | ORAL | Status: AC
  Administered 2015-07-08: 500 mg via ORAL
  Filled 2015-07-08: qty 2

## 2015-07-08 NOTE — Discharge Instructions (Signed)
Chest Wall Pain Take naproxen for pain. Chest wall pain is pain in or around the bones and muscles of your chest. It may take up to 6 weeks to get better. It may take longer if you must stay physically active in your work and activities.  CAUSES  Chest wall pain may happen on its own. However, it may be caused by:  A viral illness like the flu.  Injury.  Coughing.  Exercise.  Arthritis.  Fibromyalgia.  Shingles. HOME CARE INSTRUCTIONS   Avoid overtiring physical activity. Try not to strain or perform activities that cause pain. This includes any activities using your chest or your abdominal and side muscles, especially if heavy weights are used.  Put ice on the sore area.  Put ice in a plastic bag.  Place a towel between your skin and the bag.  Leave the ice on for 15-20 minutes per hour while awake for the first 2 days.  Only take over-the-counter or prescription medicines for pain, discomfort, or fever as directed by your caregiver. SEEK IMMEDIATE MEDICAL CARE IF:   Your pain increases, or you are very uncomfortable.  You have a fever.  Your chest pain becomes worse.  You have new, unexplained symptoms.  You have nausea or vomiting.  You feel sweaty or lightheaded.  You have a cough with phlegm (sputum), or you cough up blood. MAKE SURE YOU:   Understand these instructions.  Will watch your condition.  Will get help right away if you are not doing well or get worse. Document Released: 11/07/2005 Document Revised: 01/30/2012 Document Reviewed: 07/04/2011 [facility] Patient Information 2015 [place], [place]. This information is not intended to replace advice given to you by your health care provider. Make sure you discuss any questions you have with your health care provider.  Emergency Department Resource Guide 1) Find a Doctor and Pay Out of Pocket Although you won't have to find out who is covered by your insurance plan, it is a good idea to ask around and  get recommendations. You will then need to call the office and see if the doctor you have chosen will accept you as a new patient and what types of options they offer for patients who are self-pay. Some doctors offer discounts or will set up payment plans for their patients who do not have insurance, but you will need to ask so you aren't surprised when you get to your appointment.  2) Contact Your Local Health Department Not all health departments have doctors that can see patients for sick visits, but many do, so it is worth a call to see if yours does. If you don't know where your local health department is, you can check in your phone book. The CDC also has a tool to help you locate your state's health department, and many state websites also have listings of all of their local health departments.  3) Find a [facility] If your illness is not likely to be very severe or complicated, you may want to try a walk in clinic. These are popping up all over the country in pharmacies, drugstores, and shopping centers. They're usually staffed by nurse practitioners or physician assistants that have been trained to treat common illnesses and complaints. They're usually fairly quick and inexpensive. However, if you have serious medical issues or chronic medical problems, these are probably not your best option.  No Primary Care Doctor: - Call Health Connect at  [phone] - they can help you locate a primary  care doctor that  accepts your insurance, provides certain services, etc. - Physician Referral Service- [phone]  Chronic Pain Problems: Organization         Address  Phone   Notes  [facility]  [phone] Patients need to be referred by their primary care doctor.   Medication Assistance: Organization         Address  Phone   Notes  [facility] Medication [facility] [address] [place], [place] [address] [phone] --Must be a resident of  [facility] -- Must have NO insurance coverage whatsoever (no Medicaid/ Medicare, etc.) -- The pt. MUST have a primary care doctor that directs their care regularly and follows them in the community   [facility]ist  [phone]   [employer]  [phone]    Agencies that provide inexpensive medical care: Organization         Address  Phone   Notes  [place]  [phone]   [facility]    [phone]   [facility] [place], [place] [address] [phone]   [place] [address], [place] [phone]   Planned Parenthood    [phone]   [facility]    [phone]   [place] and [place] [address], [place] Phone:  [phone], Fax:  [phone] Hours of Operation:  9 am - 6 pm, M-F.  Also accepts Medicaid/Medicare and self-pay.  [facility] for [place] [address], [address], [place] Phone: [phone], Fax: [phone]. Hours of Operation:  8:30 am - 5:30 pm, M-F.  Also accepts Medicaid and self-pay.  [facility] [place] [address] [place] Phone: [phone]   [place], [place], [place] [phone], Ext. [phone] Mondays & Thursdays: 7-9 AM.  First 15 patients are seen on a first come, first serve basis.    [place] Providers:  Organization         Address  Phone   Notes  [facility] [address], [place] [phone] Also accepts self-pay patients.  [facility] [address], [place]  [phone]   [place], [address], [place] [phone]   [facility] [address] [place] [phone]   Lucianne Lei [address], [place]   [phone] Only accepts [place] Access  [place] patients after they have their name applied to their card.   Self-Pay (no insurance) in [facility]:  Organization         Address  Phone   Notes  Sickle Cell Patients, [facility] Internal Medicine [place] [phone]   [facility] Urgent Care [place] [phone]   [facility] [place]  [place], [place], [place] [phone]   [facility]/Dr. Osei-Bonsu  [address] [place] or [address], [place] [phone] Phone number for both [place] and [place] locations is the same.  Urgent Medical and [facility] [address], [place] [phone]   [facility] [place] [address], [place] or [address] [phone]  [facility] [place] [phone], phone; [phone], fax Sees patients 1st and 3rd Saturday of every month.  Must not qualify for public or private insurance (i.e. Medicaid, Medicare, [place] Health Choice, Veterans' Benefits)  Household income should be no more than 200% of the poverty level The clinic cannot treat you if you are pregnant or think you are pregnant  Sexually transmitted diseases are not treated at the clinic.    Dental Care: Organization         Address  Phone  Notes  [facility] Department of [facility] [place] [phone] Accepts children up to age 55 who are enrolled in [place] or [place]; pregnant women with a Medicaid card; and children who have applied for Medicaid or [place] Health Choice, but were declined, whose parents can pay a reduced fee at time of service.  [facility] Department of [facility]  [address], [place] [phone] Accepts children up to age 77 who are enrolled in [place] or [place]; pregnant women with a Medicaid  card; and children who have applied for Medicaid or [place] Health Choice, but were declined, whose parents can pay a reduced fee at time of service.  [place] Adult Dental Access PROGRAM  [place] [phone] Patients are seen by appointment only. Walk-ins are not accepted. [place] will see patients 72 years of age and older. Monday - Tuesday (8am-5pm) Most Wednesdays (8:30-5pm) $30 per visit, cash only  [facility] Adult Dental Access PROGRAM  [address], [facility] [phone] Patients are seen by appointment only. Walk-ins are not accepted. [place] will see patients 58 years of age and older. One Wednesday Evening (Monthly: Volunteer Based).  $30 per visit, cash only  [place]  [phone] for adults; Children under age 38, call Graduate Pediatric Dentistry at [phone]. Children aged 60-14, please call [phone] to request a pediatric application.  Dental services are provided in all areas of dental care including fillings, crowns and bridges, complete and partial dentures, implants, gum treatment, root canals, and extractions. Preventive care is also provided. Treatment is provided to both adults and children. Patients are selected via a lottery and there is often a waiting list.   [facility] [address], [place]  [phone] [email or web address]   [facility] [address] [place], [place] [phone], Ext. [phone] Second and Fourth Thursday of each month, opens at 6:30 AM; Clinic ends at 9 AM.  Patients are seen on a first-come first-served basis, and a limited number are seen during each clinic.   [facility]  [address] Hillard Danker [place], [place] [phone]   Eligibility Requirements You must have lived in [place], [place], or [place] counties for at least the last three months.   You cannot be eligible for state or federal sponsored [employer],  including [employer], [place], or [employer].   You generally cannot be eligible for healthcare insurance through your employer.    How to apply: Eligibility screenings are held every Tuesday and Wednesday afternoon from 1:00 pm until 4:00 pm. You do not need an appointment for the interview!  [facility] [address] [place], [place]   [place]  [place] Department  [place]  Department  [phone]    Behavioral Health Resources in the Community: Intensive Outpatient Programs Organization         Address  Phone  Notes  [place]. [address], [place], [place] [phone]   [facility] [address], [place], [place]   ADS: Alcohol & Drug Svcs [address], [place], [place]   [place] [address],  [place], [place] or [phone]   Substance Abuse Resources Organization         Address  Phone  Notes  Alcohol and Drug Services  [phone]   [place]  [phone]   [facility] [place]   Chinita Pester  [phone]   Residential & Outpatient Substance Abuse Program  [phone]   Psychological Services Organization         Address  Phone  Notes  [facility]  [place]  [phone]   [place] [address], [place] or [phone]    Mobile Crisis Teams Organization         Address  Phone  Notes  [facility], Mobile Crisis Care Unit  [phone]   [facility]  [address] [place], [place]   [place] [address] [place] [phone]    Self-Help/Support Groups Organization         Address  Phone             Notes  [place].  of [place] - variety of support groups  [place] Call for more information  Narcotics Anonymous (NA), [facility]aring [facility] [address], [employer] [place]  2 meetings at this location   Special educational needs teacher         Address  Phone  Notes  [facility] Residential Treatment [place],    [place]  [phone]   [facility]  [address] [place] [address], [place], [place]   [place], [place] [phone] Admissions: 8am-3pm M-F  Incentives Substance [place] [address]    [place], [place] [phone]   [facility] [address] [place], [place], [place]   [facility] [address],  [place], [place]   [facility]ight Programs - Intensive Outpatient [address], [place], [place]   [facility] ([place] [address] [place].,  [place], [place] [phone] or [phone]   [facility]idential Treatment Services (RTS) [address] [place], [place] Accepts Medicaid  [facility] [place] [address],  [place] [phone] Substance Abuse/Addiction Treatment   [facility] Organization         Address  Phone  Notes  [facility]  [phone]   Domenic Schwab, PhD [address] Arlis Porta [place], [place]   [phone] or [phone]   [place]aves [place], [place] [phone]   [place] [address] [place], [place] [phone] Insurance/Medicaid/sponsorship through [employer] and Families [address] [place]                                    [place], [place] [phone] [place]  [place], [place] [phone]    Dr. Adele Schilder  [phone]   [facility] Dept. 1) [address] [place] 2)  [place] Community 3)  [address], [place] [phone]  [phone]   [place] [phone] or [phone] (After Hours)

## 2015-07-08 NOTE — ED Notes (Signed)
Pt given 2 rx for Robaxin and Naproxen.

## 2015-07-08 NOTE — ED Notes (Signed)
Pt complains of right sided pain with pain. Lifted something two weeks ago and thinks bruise.

## 2015-07-08 NOTE — ED Provider Notes (Signed)
CSN: [id number]     Arrival date & time 07/08/15  1539 History   First MD Initiated Contact with Patient 07/08/15 1556     Chief Complaint  Patient presents with  . right sided pain      (Consider location/radiation/quality/duration/timing/severity/associated sxs/prior Treatment) The history is provided by the patient. No language interpreter was used.  Ms. Bubeck is a 29 y.o female with a history of asthma who presents for right-sided rib pain after lifting and pulling while working at [employer]. She states it has worsened over the past week with movement including walking, breathing, changing positions. She is taken ibuprofen 3 times for pain with minimal improvement. She denies any injury or fall.  Past Medical History  Diagnosis Date  . Asthma   . Fibroid    Past Surgical History  Procedure Laterality Date  . Tooth extraction     Family History  Problem Relation Age of Onset  . Other Neg Hx    Social History  Substance Use Topics  . Smoking status: Never Smoker   . Smokeless tobacco: None  . Alcohol Use: Yes     Comment: occ   OB History    No data available     Review of Systems  Constitutional: Negative for fever.  Respiratory: Negative for shortness of breath.   Gastrointestinal: Negative for nausea, vomiting and abdominal pain.      Allergies  Review of patient's allergies indicates no known allergies.  Home Medications   Prior to Admission medications   Medication Sig Start Date End Date Taking? Authorizing Provider  methocarbamol (ROBAXIN) 500 MG tablet Take 1 tablet (500 mg total) by mouth 2 (two) times daily. 07/08/15   Chastidy Ranker Patel-Mills, PA-C  naproxen (NAPROSYN) 500 MG tablet Take 1 tablet (500 mg total) by mouth 2 (two) times daily. 07/08/15   Adam Demary Patel-Mills, PA-C  ondansetron (ZOFRAN) 4 MG tablet Take 1 tablet (4 mg total) by mouth every 6 (six) hours as needed for nausea or vomiting. 11/17/13   Sherwood Gambler, MD   BP 136/71 mmHg  Pulse 83   Temp(Src) 97.7 F (36.5 C) (Oral)  Resp 16  Ht 5\' 6"  (1.676 m)  Wt 255 lb 12.8 oz (116.03 kg)  BMI 41.31 kg/m2  SpO2 100%  LMP 06/14/2015 Physical Exam  Constitutional: She is oriented to person, place, and time. She appears well-developed and well-nourished.  HENT:  Head: Normocephalic and atraumatic.  Eyes: Conjunctivae are normal.  Neck: Normal range of motion.  Cardiovascular: Normal rate, regular rhythm and normal heart sounds.   Pulmonary/Chest: Effort normal and breath sounds normal. No respiratory distress. She has no wheezes. She has no rales. She exhibits tenderness. She exhibits no crepitus, no deformity and no swelling.    Clear to auscultation bilaterally.  No decreased breath sounds or wheezing. No difficulty breathing or tripoding.  Abdominal: Soft. She exhibits no distension. There is no tenderness. There is no rebound and no guarding.  No CVA tenderness.  Musculoskeletal: Normal range of motion.  Neurological: She is alert and oriented to person, place, and time.  Skin: Skin is warm and dry.  Psychiatric: She has a normal mood and affect. Her behavior is normal.  Nursing note and vitals reviewed.   ED Course  Procedures (including critical care time) Labs Review Labs Reviewed - No data to display  Imaging Review Dg Ribs Unilateral W/chest Right  07/08/2015   CLINICAL DATA:  Right anterior rib pain for 2 weeks.  EXAM: RIGHT RIBS AND  CHEST - 3+ VIEW  COMPARISON:  11/17/2013  FINDINGS: No fracture or other bone lesions are seen involving the ribs. There is no evidence of pneumothorax or pleural effusion. Both lungs are clear. Heart size and mediastinal contours are within normal limits.  IMPRESSION: Negative.   Electronically Signed   By: Rolm Baptise M.D.   On: 07/08/2015 16:27   I have personally reviewed and evaluated these images and lab results as part of my medical decision-making.   EKG Interpretation None      MDM   Final diagnoses:  Chest wall  pain   Vitals stable.  100 oxygen on RA. Well appearing and in no acute distress. Xray of right ribs shows no acute fracture or dislocation, or pneumothorax. This is mostly likely chest wall pain/strain from lifting.  I prescribed her naproxen and robaxin.  Patient verbally agrees with the plan.  Medications  naproxen (NAPROSYN) tablet 500 mg (500 mg Oral Given 07/08/15 1659)  cyclobenzaprine (FLEXERIL) tablet 5 mg (5 mg Oral Given 07/08/15 1700)      Ottie Glazier, PA-C 07/08/15 1703  Davonna Belling, MD 07/08/15 2322

## 2017-06-05 ENCOUNTER — Encounter (HOSPITAL_COMMUNITY): Payer: Self-pay | Admitting: Emergency Medicine

## 2017-06-05 ENCOUNTER — Emergency Department (HOSPITAL_COMMUNITY)
Admission: EM | Admit: 2017-06-05 | Discharge: 2017-06-05 | Disposition: A | Discharge status: Home / Self Care | Payer: Self-pay | Attending: Emergency Medicine | Admitting: Emergency Medicine

## 2017-06-05 DIAGNOSIS — J45909 Unspecified asthma, uncomplicated: Secondary | ICD-10-CM | POA: Insufficient documentation

## 2017-06-05 DIAGNOSIS — J00 Acute nasopharyngitis [common cold]: Secondary | ICD-10-CM | POA: Insufficient documentation

## 2017-06-05 DIAGNOSIS — K047 Periapical abscess without sinus: Secondary | ICD-10-CM | POA: Insufficient documentation

## 2017-06-05 MED ORDER — PENICILLIN V POTASSIUM 500 MG PO TABS: 500.0000 mg | ORAL_TABLET | Freq: Four times a day (QID) | ORAL | 0 refills | Status: DC

## 2017-06-05 MED ORDER — CETIRIZINE HCL 10 MG PO TABS: 10.0000 mg | ORAL_TABLET | Freq: Every day | ORAL | 1 refills | Status: DC

## 2017-06-05 MED ORDER — BUPIVACAINE-EPINEPHRINE (PF) 0.5% -1:200000 IJ SOLN
1.8000 mL | Freq: Once | INTRAMUSCULAR | Status: AC
  Administered 2017-06-05: 1.8 mL
  Filled 2017-06-05: qty 1.8

## 2017-06-05 MED ORDER — ACETAMINOPHEN 325 MG PO TABS
650.0000 mg | ORAL_TABLET | Freq: Once | ORAL | Status: AC
  Administered 2017-06-05: 650 mg via ORAL
  Filled 2017-06-05: qty 2

## 2017-06-05 MED ORDER — NAPROXEN 500 MG PO TABS: 500.0000 mg | ORAL_TABLET | Freq: Two times a day (BID) | ORAL | 0 refills | Status: DC

## 2017-06-05 MED ORDER — FLUTICASONE PROPIONATE 50 MCG/ACT NA SUSP: 2.0000 | Freq: Every day | NASAL | 0 refills | Status: DC

## 2017-06-05 MED FILL — NAPROXEN 500 MG TABLET: 500 | 15 days supply | Qty: 30 | Fill #0

## 2017-06-05 MED FILL — ALL DAY ALLERGY 10 MG TAB: 10 | 100 days supply | Qty: 100 | Fill #0

## 2017-06-05 MED FILL — FLUTICASONE PROP 50 MCG SPR: 50 | 30 days supply | Qty: 16 | Fill #0

## 2017-06-05 MED FILL — PENICILLIN VK 500 MG TABLET: 500 | 10 days supply | Qty: 40 | Fill #0

## 2017-06-05 NOTE — ED Provider Notes (Signed)
[facility] DEPT Provider Note   CSN: [id number] Arrival date & time: 06/05/17  8185  By signing my name below, I, Theresia Bough, attest that this documentation has been prepared under the direction and in the presence of non-physician practitioner, Waynetta Pean, PA-C. Electronically Signed: Theresia Bough, ED Scribe. 06/05/17. 12:23 PM.  History   Chief Complaint Chief Complaint  Patient presents with  . URI  . Dental Pain   The history is provided by the patient. No language interpreter was used.   HPI Comments: Katie Bauer is a 31 y.o. female who presents to the Emergency Department complaining of left upper dental pain onset 3 days ago. Pt states pain is exacerbated by eating and drinking. She reports some swelling to her face today. Pt reports associated nasal congestion, subjective fever (2 days ago) and postnasal drip. Pt has tried Ibuprofen with minimal relief of her pain Pt denies ear pain,trouble swallowing or any other complaints at this time.  Past Medical History:  Diagnosis Date  . Asthma   . Fibroid     Patient Active Problem List   Diagnosis Date Noted  . 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 TOOTH EXTRACTION      OB History    No data available       Home Medications    Prior to Admission medications   Medication Sig Start Date End Date Taking? Authorizing Provider  cetirizine (ZYRTEC ALLERGY) 10 MG tablet Take 1 tablet (10 mg total) by mouth daily. 06/05/17   Waynetta Pean, PA-C  fluticasone (FLONASE) 50 MCG/ACT nasal spray Place 2 sprays into both nostrils daily. 06/05/17   Waynetta Pean, PA-C  methocarbamol (ROBAXIN) 500 MG tablet Take 1 tablet (500 mg total) by mouth 2 (two) times daily. Patient not taking: Reported on 06/05/2017 07/08/15   Patel-Mills, Orvil Feil, PA-C  naproxen (NAPROSYN) 500 MG tablet Take 1 tablet (500 mg total) by mouth 2 (two) times daily with a meal. 06/05/17   Waynetta Pean,  PA-C  penicillin v potassium (VEETID) 500 MG tablet Take 1 tablet (500 mg total) by mouth 4 (four) times daily. 06/05/17   Waynetta Pean, PA-C    Family History Family History  Problem Relation Age of Onset  . Other Neg Hx     Social History Social History  Substance Use Topics  . Smoking status: Never Smoker  . Smokeless tobacco: Not on file  . Alcohol use Yes     Comment: occ     Allergies   Patient has no known allergies.   Review of Systems Review of Systems  Constitutional: Positive for fever (subjective). Negative for chills.  HENT: Positive for congestion, facial swelling, postnasal drip, rhinorrhea and sneezing. Negative for drooling, ear pain, sore throat and trouble swallowing.   Eyes: Negative for pain and visual disturbance.  Respiratory: Negative for cough and shortness of breath.   Gastrointestinal: Negative for abdominal pain, nausea and vomiting.  Musculoskeletal: Negative for neck pain and neck stiffness.  Skin: Negative for rash.  Neurological: Negative for headaches.     Physical Exam Updated Vital Signs BP (!) 163/96   Pulse 79   Temp 98.2 F (36.8 C)   Resp 18   LMP 05/24/2017 (Approximate)   SpO2 99%   Physical Exam  Constitutional: She is oriented to person, place, and time. She appears well-developed and well-nourished. No distress.  Non-toxic appearing.   HENT:  Head: Normocephalic and atraumatic.  Right Ear: External ear  normal.  Left Ear: External ear normal.  Mouth/Throat: Oropharynx is clear and moist. No oropharyngeal exudate.  Mild, clear middle ear effusion bilaterally without erythematous TMs or loss of landmarks. Tenderness to left upper molar with area of fluctuance present.  No discharge from the mouth. Mild left sided facial swelling noted. No trismus. Uvula is midline without edema. Soft palate rises symmetrically. No tonsillar hypertrophy or exudates. Tongue protrusion is normal. No trismus. No PTA.    Eyes: Pupils are  equal, round, and reactive to light. Conjunctivae and EOM are normal. Right eye exhibits no discharge. Left eye exhibits no discharge.  Neck: Normal range of motion. Neck supple. No JVD present. No tracheal deviation present.  Cardiovascular: Normal rate, regular rhythm, normal heart sounds and intact distal pulses.   Pulmonary/Chest: Effort normal and breath sounds normal. No stridor. No respiratory distress.  Abdominal: Soft. There is no tenderness.  Lymphadenopathy:    She has no cervical adenopathy.  Neurological: She is alert and oriented to person, place, and time. No cranial nerve deficit. Coordination normal.  Skin: Skin is warm and dry. Capillary refill takes less than 2 seconds. No rash noted. She is not diaphoretic. No erythema. No pallor.  Psychiatric: She has a normal mood and affect. Her behavior is normal.  Nursing note and vitals reviewed.    ED Treatments / Results  DIAGNOSTIC STUDIES: Oxygen Saturation is 99% on RA, normal by my interpretation.   COORDINATION OF CARE: 11:46 AM-Discussed next steps with pt including treatment with antibiotics, I&D, and follow up with a dentist. Pt verbalized understanding and is agreeable with the plan.   Labs (all labs ordered are listed, but only abnormal results are displayed) Labs Reviewed - No data to display  EKG  EKG Interpretation None       Radiology No results found.  Procedures .Marland KitchenIncision and Drainage Date/Time: 06/05/2017 12:23 PM Performed by: Waynetta Pean Authorized by: Waynetta Pean   Consent:    Consent obtained:  Verbal   Consent given by:  Patient   Risks discussed:  Bleeding, pain and incomplete drainage   Alternatives discussed:  No treatment Universal protocol:    Procedure explained and questions answered to patient or proxy's satisfaction: yes     Relevant documents present and verified: yes     Site/side marked: yes     Immediately prior to procedure a time out was called: yes      Patient identity confirmed:  Verbally with patient Location:    Type:  Abscess   Size:  2 cm   Location:  Mouth   Mouth location:  Alveolar process Anesthesia (see MAR for exact dosages):    Anesthesia method:  Topical application and local infiltration   Topical anesthetic:  Lidocaine gel   Local anesthetic:  Bupivacaine 0.5% WITH epi Procedure type:    Complexity:  Complex Procedure details:    Incision types:  Stab incision   Incision depth:  Submucosal   Scalpel blade:  15   Wound management:  Probed and deloculated and irrigated with saline   Drainage:  Bloody and purulent   Drainage amount:  Moderate   Wound treatment:  Wound left open   Packing materials:  None Post-procedure details:    Patient tolerance of procedure:  Tolerated well, no immediate complications     (including critical care time)  Medications Ordered in ED Medications  acetaminophen (TYLENOL) tablet 650 mg (650 mg Oral Given 06/05/17 1159)  bupivacaine-epinephrine (MARCAINE W/ EPI) 0.5% -1:200000 injection  1.8 mL (1.8 mLs Infiltration Given by Other 06/05/17 1230)     Initial Impression / Assessment and Plan / ED Course  I have reviewed the triage vital signs and the nursing notes.  Pertinent labs & imaging results that were available during my care of the patient were reviewed by me and considered in my medical decision making (see chart for details).     This is a 31 y.o. female who presents to the Emergency Department complaining of left upper dental pain onset 3 days ago. Pt states pain is exacerbated by eating and drinking. She reports some swelling to her face today. On exam patient is afebrile nontoxic appearing. She has tenderness and fluctuance noted to her left upper alveolar process. No discharge noted. No trismus. Tongue protrusion normal. Throat is clear. I discussed options with the patient. Patient agrees with plan for incision and drainage of dental abscess. Patient tolerated procedure  well. Moderate amount of purulent material obtained. We'll discharge with penicillin VK and naproxen for pain control. Flonase and Zyrtec for her URI symptoms. Dental resource guide provided. Encouraged close follow-up with a dental provider. Return precautions discussed. I advised the patient to follow-up with their primary care provider this week. I advised the patient to return to the emergency department with new or worsening symptoms or new concerns. The patient verbalized understanding and agreement with plan.      Final Clinical Impressions(s) / ED Diagnoses   Final diagnoses:  Dental abscess  Acute nasopharyngitis    New Prescriptions New Prescriptions   CETIRIZINE (ZYRTEC ALLERGY) 10 MG TABLET    Take 1 tablet (10 mg total) by mouth daily.   FLUTICASONE (FLONASE) 50 MCG/ACT NASAL SPRAY    Place 2 sprays into both nostrils daily.   NAPROXEN (NAPROSYN) 500 MG TABLET    Take 1 tablet (500 mg total) by mouth 2 (two) times daily with a meal.   PENICILLIN V POTASSIUM (VEETID) 500 MG TABLET    Take 1 tablet (500 mg total) by mouth 4 (four) times daily.   I personally performed the services described in this documentation, which was scribed in my presence. The recorded information has been reviewed and is accurate.      Waynetta Pean, PA-C 06/05/17 1234    Dorie Rank, MD 06/05/17 [phone]

## 2017-06-05 NOTE — ED Notes (Signed)
Bed: WTR8 Expected date:  Expected time:  Means of arrival:  Comments: 

## 2017-06-05 NOTE — ED Triage Notes (Signed)
Pt states that she has had URI symptoms x 1 week and has had L sided (upper) dental pain with associated swelling noted. Alert and oriented.

## 2017-07-13 ENCOUNTER — Encounter (HOSPITAL_COMMUNITY): Payer: Self-pay | Admitting: Emergency Medicine

## 2017-07-13 ENCOUNTER — Emergency Department (HOSPITAL_COMMUNITY)
Admission: EM | Admit: 2017-07-13 | Discharge: 2017-07-13 | Disposition: A | Discharge status: Home / Self Care | Payer: Self-pay | Attending: Emergency Medicine | Admitting: Emergency Medicine

## 2017-07-13 DIAGNOSIS — J029 Acute pharyngitis, unspecified: Secondary | ICD-10-CM | POA: Insufficient documentation

## 2017-07-13 DIAGNOSIS — R05 Cough: Secondary | ICD-10-CM | POA: Insufficient documentation

## 2017-07-13 DIAGNOSIS — J45909 Unspecified asthma, uncomplicated: Secondary | ICD-10-CM | POA: Insufficient documentation

## 2017-07-13 DIAGNOSIS — R509 Fever, unspecified: Secondary | ICD-10-CM | POA: Insufficient documentation

## 2017-07-13 MED ORDER — HYDROCODONE-ACETAMINOPHEN 7.5-325 MG/15ML PO SOLN: 15.0000 mL | Freq: Three times a day (TID) | ORAL | 0 refills | Status: DC | PRN

## 2017-07-13 MED ORDER — AMOXICILLIN 500 MG PO CAPS: 500.0000 mg | ORAL_CAPSULE | Freq: Three times a day (TID) | ORAL | 0 refills | Status: DC

## 2017-07-13 MED ORDER — AMOXICILLIN 500 MG PO CAPS
500.0000 mg | ORAL_CAPSULE | Freq: Once | ORAL | Status: AC
  Administered 2017-07-13: 500 mg via ORAL
  Filled 2017-07-13: qty 1

## 2017-07-13 MED ORDER — ACETAMINOPHEN 500 MG PO TABS
1000.0000 mg | ORAL_TABLET | Freq: Once | ORAL | Status: AC
  Administered 2017-07-13: 1000 mg via ORAL
  Filled 2017-07-13: qty 2

## 2017-07-13 MED ORDER — DEXAMETHASONE SODIUM PHOSPHATE 10 MG/ML IJ SOLN
10.0000 mg | Freq: Once | INTRAMUSCULAR | Status: AC
  Administered 2017-07-13: 10 mg via INTRAMUSCULAR
  Filled 2017-07-13: qty 1

## 2017-07-13 MED FILL — HYDROCOD-APAP 7.5-325/15ML: 7.5-325 | 3 days supply | Qty: 120 | Fill #0

## 2017-07-13 MED FILL — AMOXICILLIN 500 MG CAPSULE: 500 | 7 days supply | Qty: 21 | Fill #0

## 2017-07-13 NOTE — ED Notes (Signed)
Bed: WTR9 Expected date:  Expected time:  Means of arrival:  Comments: 

## 2017-07-13 NOTE — ED Provider Notes (Signed)
[facility] DEPT Provider Note   CSN: [id number] Arrival date & time: 07/13/17  4098     History   Chief Complaint Chief Complaint  Patient presents with  . Sore Throat    HPI Katie Bauer is a 31 y.o. female.  Patient presents to the ED with a chief complaint of sore throat.  She states that the symptoms started about a week ago.  She reports associated fevers and chills.  Also reports some cough.  Denies any other associated symptoms.  Has tried taking OTC meds with no relief.  There are no modifying factors.   The history is provided by the patient. No language interpreter was used.    Past Medical History:  Diagnosis Date  . Asthma   . Fibroid     Patient Active Problem List   Diagnosis Date Noted  . 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 TOOTH EXTRACTION      OB History    No data available       Home Medications    Prior to Admission medications   Medication Sig Start Date End Date Taking? Authorizing Provider  amoxicillin (AMOXIL) 500 MG capsule Take 1 capsule (500 mg total) by mouth 3 (three) times daily. 07/13/17   Montine Circle, PA-C  cetirizine (ZYRTEC ALLERGY) 10 MG tablet Take 1 tablet (10 mg total) by mouth daily. Patient not taking: Reported on 07/13/2017 06/05/17   Waynetta Pean, PA-C  fluticasone [facility]) 50 MCG/ACT nasal spray Place 2 sprays into both nostrils daily. Patient not taking: Reported on 07/13/2017 06/05/17   Waynetta Pean, PA-C  HYDROcodone-acetaminophen (HYCET) 7.5-325 mg/15 ml solution Take 15 mLs by mouth every 8 (eight) hours as needed for moderate pain. 07/13/17   Montine Circle, PA-C  methocarbamol (ROBAXIN) 500 MG tablet Take 1 tablet (500 mg total) by mouth 2 (two) times daily. Patient not taking: Reported on 06/05/2017 07/08/15   Patel-Mills, Orvil Feil, PA-C  naproxen (NAPROSYN) 500 MG tablet Take 1 tablet (500 mg total) by mouth 2 (two) times daily with a meal. Patient not  taking: Reported on 07/13/2017 06/05/17   Waynetta Pean, PA-C  penicillin v potassium (VEETID) 500 MG tablet Take 1 tablet (500 mg total) by mouth 4 (four) times daily. Patient not taking: Reported on 07/13/2017 06/05/17   Waynetta Pean, PA-C    Family History Family History  Problem Relation Age of Onset  . Other Neg Hx     Social History Social History  Substance Use Topics  . Smoking status: Never Smoker  . Smokeless tobacco: Never Used  . Alcohol use Yes     Comment: occ     Allergies   Patient has no known allergies.   Review of Systems Review of Systems  All other systems reviewed and are negative.    Physical Exam Updated Vital Signs BP 126/85 (BP Location: Left Arm)   Pulse 95   Temp 99.9 F (37.7 C) (Oral)   Resp 18   Ht 5\' 6"  (1.676 m)   Wt 107 kg (236 lb)   LMP 06/30/2017   SpO2 100%   BMI 38.09 kg/m   Physical Exam  Constitutional: She is oriented to person, place, and time. She appears well-developed and well-nourished.  HENT:  Head: Normocephalic and atraumatic.  Oropharynx is erythematous, no tonsillar exudates, no abscess, uvula is midline, tolerating secretions, airway intact  Eyes: Pupils are equal, round, and reactive to light. Conjunctivae and EOM are normal.  Neck:  Normal range of motion. Neck supple.  Cardiovascular: Normal rate and regular rhythm.  Exam reveals no gallop and no friction rub.   No murmur heard. Pulmonary/Chest: Effort normal and breath sounds normal. No respiratory distress. She has no wheezes. She has no rales. She exhibits no tenderness.  Abdominal: Soft. Bowel sounds are normal. She exhibits no distension and no mass. There is no tenderness. There is no rebound and no guarding.  Musculoskeletal: Normal range of motion. She exhibits no edema or tenderness.  Neurological: She is alert and oriented to person, place, and time.  Skin: Skin is warm and dry.  Psychiatric: She has a normal mood and affect. Her behavior is  normal. Judgment and thought content normal.  Nursing note and vitals reviewed.    ED Treatments / Results  Labs (all labs ordered are listed, but only abnormal results are displayed) Labs Reviewed - No data to display  EKG  EKG Interpretation None       Radiology No results found.  Procedures Procedures (including critical care time)  Medications Ordered in ED Medications  dexamethasone (DECADRON) injection 10 mg (not administered)  amoxicillin (AMOXIL) capsule 500 mg (not administered)  acetaminophen (TYLENOL) tablet 1,000 mg (not administered)     Initial Impression / Assessment and Plan / ED Course  I have reviewed the triage vital signs and the nursing notes.  Pertinent labs & imaging results that were available during my care of the patient were reviewed by me and considered in my medical decision making (see chart for details).     Patient with sore throat and fever.  Quite erythematous and edematous.  Will give decadron shot and treat with amox.  No visible abscess.    Final Clinical Impressions(s) / ED Diagnoses   Final diagnoses:  Sore throat    New Prescriptions New Prescriptions   AMOXICILLIN (AMOXIL) 500 MG CAPSULE    Take 1 capsule (500 mg total) by mouth 3 (three) times daily.   HYDROCODONE-ACETAMINOPHEN (HYCET) 7.5-325 MG/15 ML SOLUTION    Take 15 mLs by mouth every 8 (eight) hours as needed for moderate pain.     Montine Circle, PA-C 07/13/17 9379    Ripley Fraise, MD 07/13/17 [phone]

## 2017-07-13 NOTE — ED Triage Notes (Signed)
Pt c/o Sore throat pain onset several days ago OTC meds ineffective

## 2017-07-13 NOTE — ED Notes (Signed)
Patient was alert, oriented and stable upon discharge. RN went over AVS and patient had no further questions.  Patient was instructed not to drive or operate heavy machinery on narcotic pain medication.   

## 2017-07-28 ENCOUNTER — Emergency Department (HOSPITAL_COMMUNITY)
Admission: EM | Admit: 2017-07-28 | Discharge: 2017-07-29 | Disposition: A | Discharge status: Home / Self Care | Payer: Self-pay | Attending: Emergency Medicine | Admitting: Emergency Medicine

## 2017-07-28 ENCOUNTER — Encounter (HOSPITAL_COMMUNITY): Payer: Self-pay

## 2017-07-28 ENCOUNTER — Emergency Department (HOSPITAL_COMMUNITY): Payer: Self-pay

## 2017-07-28 DIAGNOSIS — J36 Peritonsillar abscess: Secondary | ICD-10-CM | POA: Insufficient documentation

## 2017-07-28 DIAGNOSIS — J45909 Unspecified asthma, uncomplicated: Secondary | ICD-10-CM | POA: Insufficient documentation

## 2017-07-28 DIAGNOSIS — H9191 Unspecified hearing loss, right ear: Secondary | ICD-10-CM | POA: Insufficient documentation

## 2017-07-28 LAB — BASIC METABOLIC PANEL
ANION GAP: 10 (ref 5–15)
BUN: 6 mg/dL (ref 6–20)
CHLORIDE: 104 mmol/L (ref 101–111)
CO2: 22 mmol/L (ref 22–32)
Calcium: 9 mg/dL (ref 8.9–10.3)
Creatinine, Ser: 0.61 mg/dL (ref 0.44–1.00)
GFR calc Af Amer: >60 mL/min (ref >60)
GFR calc non Af Amer: >60 mL/min (ref >60)
GLUCOSE: 100 mg/dL — AB (ref 65–99)
POTASSIUM: 3.6 mmol/L (ref 3.5–5.1)
Sodium: 136 mmol/L (ref 135–145)

## 2017-07-28 LAB — CBC WITH DIFFERENTIAL/PLATELET
BASOS ABS: 0 10*3/uL (ref 0.0–0.1)
Basophils Relative: 0 %
Eosinophils Absolute: 0.1 10*3/uL (ref 0.0–0.7)
Eosinophils Relative: 0 %
HCT: 35.9 % — ABNORMAL LOW (ref 36.0–46.0)
Hemoglobin: 12 g/dL (ref 12.0–15.0)
LYMPHS PCT: 8 %
Lymphs Abs: 1.3 10*3/uL (ref 0.7–4.0)
MCH: 25.8 pg — ABNORMAL LOW (ref 26.0–34.0)
MCHC: 33.4 g/dL (ref 30.0–36.0)
MCV: 77 fL — AB (ref 78.0–100.0)
MONO ABS: 1.3 10*3/uL — AB (ref 0.1–1.0)
Monocytes Relative: 8 %
NEUTROS ABS: 13.8 10*3/uL — AB (ref 1.7–7.7)
NEUTROS PCT: 84 %
Platelets: 303 10*3/uL (ref 150–400)
RBC: 4.66 MIL/uL (ref 3.87–5.11)
RDW: 14.9 % (ref 11.5–15.5)
WBC: 16.5 10*3/uL — ABNORMAL HIGH (ref 4.0–10.5)

## 2017-07-28 LAB — I-STAT BETA HCG BLOOD, ED (MC, WL, AP ONLY): I-stat hCG, quantitative: <5 m[IU]/mL (ref <5)

## 2017-07-28 LAB — RAPID STREP SCREEN (MED CTR MEBANE ONLY): STREPTOCOCCUS, GROUP A SCREEN (DIRECT): POSITIVE — AB

## 2017-07-28 MED ORDER — CLINDAMYCIN PHOSPHATE 600 MG/50ML IV SOLN
600.0000 mg | Freq: Once | INTRAVENOUS | Status: AC
  Administered 2017-07-28: 600 mg via INTRAVENOUS
  Filled 2017-07-28: qty 50

## 2017-07-28 MED ORDER — CLINDAMYCIN HCL 150 MG PO CAPS: 450.0000 mg | ORAL_CAPSULE | Freq: Three times a day (TID) | ORAL | 0 refills | Status: AC

## 2017-07-28 MED ORDER — DEXAMETHASONE SODIUM PHOSPHATE 10 MG/ML IJ SOLN
10.0000 mg | Freq: Once | INTRAMUSCULAR | Status: AC
  Administered 2017-07-28: 10 mg via INTRAVENOUS
  Filled 2017-07-28: qty 1

## 2017-07-28 MED ORDER — IOPAMIDOL (ISOVUE-300) INJECTION 61%
INTRAVENOUS | Status: AC
  Filled 2017-07-28: qty 75

## 2017-07-28 MED ORDER — DEXAMETHASONE 4 MG PO TABS: 10.0000 mg | ORAL_TABLET | Freq: Once | ORAL | 0 refills | Status: AC

## 2017-07-28 MED ORDER — LIDOCAINE VISCOUS 2 % MT SOLN
15.0000 mL | Freq: Once | OROMUCOSAL | Status: AC
  Administered 2017-07-28: 15 mL via OROMUCOSAL
  Filled 2017-07-28: qty 15

## 2017-07-28 MED ORDER — LIDOCAINE HCL (PF) 1 % IJ SOLN
20.0000 mL | Freq: Once | INTRAMUSCULAR | Status: AC
  Administered 2017-07-28: 20 mL
  Filled 2017-07-28: qty 30

## 2017-07-28 MED ORDER — IOPAMIDOL (ISOVUE-300) INJECTION 61%
75.0000 mL | Freq: Once | INTRAVENOUS | Status: AC | PRN
  Administered 2017-07-28: 75 mL via INTRAVENOUS

## 2017-07-28 NOTE — ED Notes (Signed)
No blood return from IV.

## 2017-07-28 NOTE — ED Provider Notes (Signed)
[facility] DEPT Provider Note   CSN: [id number] Arrival date & time: 07/28/17  1542     History   Chief Complaint Chief Complaint  Patient presents with  . Sore Throat  . Otalgia    right ear.    HPI Katie Bauer is a 31 y.o. female.  HPI 31 year old female recently treated for possible strep throat with a ten-day course of amoxicillin presents to the emergency department for worsening sore throat with pain radiating to the right ear, odynophagia. No overt fevers but mild chills. No nuchal rigidity. She is endorsing mild headache. No other alleviating or aggravating factors. No other physical complaints  Past Medical History:  Diagnosis Date  . Asthma   . Fibroid     Patient Active Problem List   Diagnosis Date Noted  . 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 TOOTH EXTRACTION      OB History    No data available       Home Medications    Prior to Admission medications   Medication Sig Start Date End Date Taking? Authorizing Provider  amoxicillin (AMOXIL) 500 MG capsule Take 1 capsule (500 mg total) by mouth 3 (three) times daily. Patient not taking: Reported on 07/28/2017 07/13/17   Montine Circle, PA-C  cetirizine (ZYRTEC ALLERGY) 10 MG tablet Take 1 tablet (10 mg total) by mouth daily. Patient not taking: Reported on 07/13/2017 06/05/17   Waynetta Pean, PA-C  clindamycin (CLEOCIN) 150 MG capsule Take 3 capsules (450 mg total) by mouth 3 (three) times daily. 07/28/17 08/04/17  Fatima Blank, MD  dexamethasone (DECADRON) 4 MG tablet Take 2.5 tablets (10 mg total) by mouth once. Take only once on 07/30/17. 07/30/17 07/30/17  Fatima Blank, MD  fluticasone [facility]) 50 MCG/ACT nasal spray Place 2 sprays into both nostrils daily. Patient not taking: Reported on 07/13/2017 06/05/17   Waynetta Pean, PA-C  HYDROcodone-acetaminophen (HYCET) 7.5-325 mg/15 ml solution Take 15 mLs by mouth every 8 (eight) hours as  needed for moderate pain. Patient not taking: Reported on 07/28/2017 07/13/17   Montine Circle, PA-C  methocarbamol (ROBAXIN) 500 MG tablet Take 1 tablet (500 mg total) by mouth 2 (two) times daily. Patient not taking: Reported on 06/05/2017 07/08/15   Patel-Mills, Orvil Feil, PA-C  naproxen (NAPROSYN) 500 MG tablet Take 1 tablet (500 mg total) by mouth 2 (two) times daily with a meal. Patient not taking: Reported on 07/13/2017 06/05/17   Waynetta Pean, PA-C  penicillin v potassium (VEETID) 500 MG tablet Take 1 tablet (500 mg total) by mouth 4 (four) times daily. Patient not taking: Reported on 07/13/2017 06/05/17   Waynetta Pean, PA-C    Family History Family History  Problem Relation Age of Onset  . Other Neg Hx     Social History Social History  Substance Use Topics  . Smoking status: Never Smoker  . Smokeless tobacco: Never Used  . Alcohol use Yes     Comment: occ     Allergies   Patient has no known allergies.   Review of Systems Review of Systems All other systems are reviewed and are negative for acute change except as noted in the HPI   Physical Exam Updated Vital Signs BP 136/87 (BP Location: Left Arm)   Pulse (!) 101   Temp 99.7 F (37.6 C) (Oral)   Resp 20   LMP 06/30/2017   SpO2 100%   Physical Exam  Constitutional: She is oriented to person, place, and  time. She appears well-developed and well-nourished. No distress.  HENT:  Head: Normocephalic and atraumatic.  Nose: Nose normal.  Mouth/Throat: There is trismus in the jaw. Tonsillar abscesses (right) present.  Eyes: Pupils are equal, round, and reactive to light. Conjunctivae and EOM are normal. Right eye exhibits no discharge. Left eye exhibits no discharge. No scleral icterus.  Neck: Normal range of motion. Neck supple.  Submandibular lymphadenopathy and tenderness to palpation.  Cardiovascular: Normal rate and regular rhythm.  Exam reveals no gallop and no friction rub.   No murmur  heard. Pulmonary/Chest: Effort normal and breath sounds normal. No stridor. No respiratory distress. She has no rales.  Abdominal: Soft. She exhibits no distension. There is no tenderness.  Musculoskeletal: She exhibits no edema or tenderness.  Neurological: She is alert and oriented to person, place, and time.  Skin: Skin is warm and dry. No rash noted. She is not diaphoretic. No erythema.  Psychiatric: She has a normal mood and affect.  Vitals reviewed.    ED Treatments / Results  Labs (all labs ordered are listed, but only abnormal results are displayed) Labs Reviewed  RAPID STREP SCREEN (NOT AT [facility]) - Abnormal; Notable for the following:       Result Value   Streptococcus, Group A Screen (Direct) POSITIVE (*)    All other components within normal limits  CBC WITH DIFFERENTIAL/PLATELET - Abnormal; Notable for the following:    WBC 16.5 (*)    HCT 35.9 (*)    MCV 77.0 (*)    MCH 25.8 (*)    Neutro Abs 13.8 (*)    Monocytes Absolute 1.3 (*)    All other components within normal limits  BASIC METABOLIC PANEL - Abnormal; Notable for the following:    Glucose, Bld 100 (*)    All other components within normal limits  I-STAT BETA HCG BLOOD, ED ([facility], [facility], [facility] ONLY)    EKG  EKG Interpretation None       Radiology Ct Soft Tissue Neck W Contrast  Result Date: 07/28/2017 CLINICAL DATA:  Initial evaluation for acute sore throat with right ear pain. EXAM: CT NECK WITH CONTRAST TECHNIQUE: Multidetector CT imaging of the neck was performed using the standard protocol following the bolus administration of intravenous contrast. CONTRAST:  17mL ISOVUE-300 IOPAMIDOL (ISOVUE-300) INJECTION 61% COMPARISON:  None available. FINDINGS: Pharynx and larynx: Oral cavity within normal limits without mass lesion or loculated fluid collection. Prominent dental carie with periapical lucency noted at the right second mandibular molar (Series 3, image 49). Additional prominent dental carie present at the  left second maxillary molar (series 3, image 30). Round hypodense loculated collection position at the lateral aspect of the right palatine tonsil measures 1.6 x 1.4 x 1.4 cm, consistent with tonsillar/ peritonsillar abscess. Tonsils themselves are fairly symmetric in size and appearance, although the right tonsil is deviated medially. Finding favored to reflect abscess related to acute tonsillitis, although infection tracking posteriorly across the retromolar trigone from the carious right mandibular molar could also be considered. Associated induration with inflammatory stranding within the right parapharyngeal fat, extending inferiorly and anteriorly into the right submandibular space. Mucosal edema and swelling within the right pharynx, extending inferiorly towards the right piriform sinus. Associated retropharyngeal effusion without frank abscess. Epiglottis is mildly thickened and edematous in appearance, suggesting associated supraglottitis. Nasopharynx within normal limits. Supraglottic airway remains patent. True cords symmetric and normal. Subglottic airway clear. Salivary glands: Salivary glands including the parotid and submandibular glands are within normal limits. Thyroid:  Thyroid normal. Lymph nodes: Enlarged bilateral level II lymph nodes, measuring up to 15 mm on the right and 14 mm on the left. Right level III lymph node measures 13 mm. Retropharyngeal lymph nodes noted as well measuring up to 11 mm on the right (series 3, image 31) These are most likely reactive. Vascular: Normal intravascular enhancement seen throughout the neck. Limited intracranial: Unremarkable. Visualized orbits: Visualized globes and orbital soft tissues within normal limits. Mastoids and visualized paranasal sinuses: Mild mucosal thickening within the right maxillary sinus. Visualized paranasal sinuses are otherwise clear. Mastoid air cells and middle ear cavities are well pneumatized and clear. Skeleton: No acute osseous  abnormality. No worrisome lytic or blastic osseous lesions. Upper chest: Visualized upper chest within normal limits. Partially visualized lungs are clear. Other: None. IMPRESSION: 1. 1.6 x 1.4 x 1.4 cm right tonsillar/peritonsillar abscess. While this finding is perhaps favored to be resultant of acute tonsillitis/pharyngitis, possible spread of infection posteriorly from a prominent dental carie at the right second mandibular molar could also be considered. Correlation with physical exam recommended. 2. Swelling with inflammatory stranding within the right pharynx with associated retropharyngeal effusion and mild epiglottic thickening, consistent with associated pharyngitis/supraglottitis. 3. Mildly enlarged cervical adenopathy is above, likely reactive. Electronically Signed   By: Jeannine Boga M.D.   On: 07/28/2017 20:13    Procedures .Marland KitchenIncision and Drainage Date/Time: 07/28/2017 10:52 PM Performed by: Fatima Blank Authorized by: Fatima Blank   Consent:    Consent obtained:  Verbal   Consent given by:  Patient   Risks discussed:  Incomplete drainage, infection, pain, bleeding and damage to other organs Location:    Type:  Abscess   Size:  1.5cm   Location:  Mouth   Mouth location:  Peritonsillar Anesthesia (see MAR for exact dosages):    Anesthesia method:  Topical application   Topical anesthetic:  Lidocaine gel Procedure type:    Complexity:  Simple Procedure details:    Needle aspiration: yes     Needle size:  18 G   Drainage:  Purulent   Drainage amount:  Moderate Post-procedure details:    Patient tolerance of procedure:  Tolerated well, no immediate complications   (including critical care time)  Medications Ordered in ED Medications  iopamidol (ISOVUE-300) 61 % injection (not administered)  dexamethasone (DECADRON) injection 10 mg (10 mg Intravenous Given 07/28/17 1901)  iopamidol (ISOVUE-300) 61 % injection 75 mL (75 mLs Intravenous Contrast  Given 07/28/17 1924)  lidocaine (PF) (XYLOCAINE) 1 % injection 20 mL (20 mLs Other Given 07/28/17 2009)  lidocaine (XYLOCAINE) 2 % viscous mouth solution 15 mL (15 mLs Mouth/Throat Given 07/28/17 2008)  clindamycin (CLEOCIN) IVPB 600 mg (0 mg Intravenous Stopped 07/28/17 2151)     Initial Impression / Assessment and Plan / ED Course  I have reviewed the triage vital signs and the nursing notes.  Pertinent labs & imaging results that were available during my care of the patient were reviewed by me and considered in my medical decision making (see chart for details).     Right peritonsillar abscess no evidence to suggest Ludwig's. Able to aspirate purulent drainage with needle aspiration. Given IV clindamycin. We'll treat with outpatient clindamycin.  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 Discharge instructions discussed at great length. The patient was given strict return precautions who verbalized understanding of the instructions. No further questions at time of discharge.    New Prescriptions   CLINDAMYCIN (CLEOCIN) 150 MG CAPSULE    Take 3 capsules (450 mg total) by mouth 3 (three) times daily.   DEXAMETHASONE (DECADRON) 4 MG TABLET    Take 2.5 tablets (10 mg total) by mouth once. Take only once on 07/30/17.    Follow Up: Primary care provider  Schedule an appointment as soon as possible for a visit    Melida Quitter, MD [address] [place] [address] [phone]  Schedule an appointment as soon as possible for a visit in 1 week if swelling not resolving      Ebony Yorio, Grayce Sessions, MD 07/28/17 2258

## 2017-07-28 NOTE — ED Triage Notes (Signed)
Patient c/o sore throat and right ear pain. Patient reports that she was seen for the same a week ago. Patient received a prescription for antibiotics and finished the Rx. Patient also has been having a fever

## 2017-07-31 MED FILL — CLINDAMYCIN HCL 150 MG CAPS: 150 | 7 days supply | Qty: 63 | Fill #0

## 2017-07-31 MED FILL — DEXAMETHASONE 4 MG TABLET: 4 | 1 days supply | Qty: 3 | Fill #0

## 2017-11-25 ENCOUNTER — Emergency Department (HOSPITAL_COMMUNITY)
Admission: EM | Admit: 2017-11-25 | Discharge: 2017-11-25 | Disposition: A | Discharge status: Home / Self Care | Payer: Self-pay | Attending: Emergency Medicine | Admitting: Emergency Medicine

## 2017-11-25 ENCOUNTER — Encounter (HOSPITAL_COMMUNITY): Payer: Self-pay | Admitting: Emergency Medicine

## 2017-11-25 DIAGNOSIS — R600 Localized edema: Secondary | ICD-10-CM | POA: Insufficient documentation

## 2017-11-25 DIAGNOSIS — L508 Other urticaria: Secondary | ICD-10-CM | POA: Insufficient documentation

## 2017-11-25 DIAGNOSIS — R21 Rash and other nonspecific skin eruption: Secondary | ICD-10-CM | POA: Insufficient documentation

## 2017-11-25 DIAGNOSIS — L509 Urticaria, unspecified: Secondary | ICD-10-CM

## 2017-11-25 DIAGNOSIS — J45909 Unspecified asthma, uncomplicated: Secondary | ICD-10-CM | POA: Insufficient documentation

## 2017-11-25 MED ORDER — METHYLPREDNISOLONE SODIUM SUCC 125 MG IJ SOLR
125.0000 mg | Freq: Once | INTRAMUSCULAR | Status: AC
  Administered 2017-11-25: 125 mg via INTRAVENOUS
  Filled 2017-11-25: qty 2

## 2017-11-25 MED ORDER — SODIUM CHLORIDE 0.9 % IV BOLUS (SEPSIS)
1000.0000 mL | Freq: Once | INTRAVENOUS | Status: AC
  Administered 2017-11-25: 1000 mL via INTRAVENOUS

## 2017-11-25 MED ORDER — FAMOTIDINE IN NACL 20-0.9 MG/50ML-% IV SOLN
20.0000 mg | INTRAVENOUS | Status: AC
  Administered 2017-11-25: 20 mg via INTRAVENOUS
  Filled 2017-11-25: qty 50

## 2017-11-25 MED ORDER — PREDNISONE 20 MG PO TABS: ORAL_TABLET | ORAL | 0 refills | Status: DC

## 2017-11-25 MED ORDER — DIPHENHYDRAMINE HCL 50 MG/ML IJ SOLN
25.0000 mg | Freq: Once | INTRAMUSCULAR | Status: AC
Start: 2017-11-25 — End: 2017-11-25
  Administered 2017-11-25: 25 mg via INTRAVENOUS
  Filled 2017-11-25: qty 1

## 2017-11-25 NOTE — Discharge Instructions (Signed)
Take the prescribed medication as directed.  Can continue benadryl every 6-8 hours for itching.  Can take pepcid to help with itching as well. Please return to the ED for new or worsening symptoms.

## 2017-11-25 NOTE — ED Provider Notes (Signed)
[facility] EMERGENCY DEPARTMENT Provider Note   CSN: [id number] Arrival date & time: 11/25/17  0244     History   Chief Complaint Chief Complaint  Patient presents with  . Urticaria    HPI Katie Bauer is a 32 y.o. female.  The history is provided by the patient and medical records.  Urticaria     32 year old female with history of asthma and uterine fibroids, presenting to the ED for urticaria.  States this began yesterday but worsening throughout the night.  States she did recently use her girlfriends soap while she was staying at her house, it was Aveeno with lavender infusion.  States she has never used this so before.  She has not had any other changes in soap, shampoo, personal care products, or detergents.  States she took some Benadryl around 10 PM and went to sleep, but woke up around 1 AM and felt like her symptoms were worse, particularly on her face.  She has generalized hives all over her body and is extremely itchy.  She denies any lip swelling, tongue swelling, difficult to swallowing, or shortness of breath.  She has no known allergies.  Past Medical History:  Diagnosis Date  . Asthma   . Fibroid     Patient Active Problem List   Diagnosis Date Noted  . 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 TOOTH EXTRACTION      OB History    No data available       Home Medications    Prior to Admission medications   Medication Sig Start Date End Date Taking? Authorizing Provider  amoxicillin (AMOXIL) 500 MG capsule Take 1 capsule (500 mg total) by mouth 3 (three) times daily. Patient not taking: Reported on 07/28/2017 07/13/17   Montine Circle, PA-C  cetirizine (ZYRTEC ALLERGY) 10 MG tablet Take 1 tablet (10 mg total) by mouth daily. Patient not taking: Reported on 07/13/2017 06/05/17   Waynetta Pean, PA-C  fluticasone [facility]) 50 MCG/ACT nasal spray Place 2 sprays into both nostrils daily. Patient  not taking: Reported on 07/13/2017 06/05/17   Waynetta Pean, PA-C  HYDROcodone-acetaminophen (HYCET) 7.5-325 mg/15 ml solution Take 15 mLs by mouth every 8 (eight) hours as needed for moderate pain. Patient not taking: Reported on 07/28/2017 07/13/17   Montine Circle, PA-C  methocarbamol (ROBAXIN) 500 MG tablet Take 1 tablet (500 mg total) by mouth 2 (two) times daily. Patient not taking: Reported on 06/05/2017 07/08/15   Patel-Mills, Orvil Feil, PA-C  naproxen (NAPROSYN) 500 MG tablet Take 1 tablet (500 mg total) by mouth 2 (two) times daily with a meal. Patient not taking: Reported on 07/13/2017 06/05/17   Waynetta Pean, PA-C  penicillin v potassium (VEETID) 500 MG tablet Take 1 tablet (500 mg total) by mouth 4 (four) times daily. Patient not taking: Reported on 07/13/2017 06/05/17   Waynetta Pean, PA-C    Family History Family History  Problem Relation Age of Onset  . Other Neg Hx     Social History Social History   Tobacco Use  . Smoking status: Never Smoker  . Smokeless tobacco: Never Used  Substance Use Topics  . Alcohol use: Yes    Comment: occ  . Drug use: No     Allergies   Patient has no known allergies.   Review of Systems Review of Systems  Skin: Positive for rash.  All other systems reviewed and are negative.    Physical Exam Updated Vital Signs  BP (!) 145/90 (BP Location: Right Arm)   Pulse 88   Temp 98.7 F (37.1 C) (Oral)   Resp 18   Ht 5\' 6"  (1.676 m)   Wt 97.5 kg (215 lb)   SpO2 100%   BMI 34.70 kg/m   Physical Exam  Constitutional: She is oriented to person, place, and time. She appears well-developed and well-nourished.  HENT:  Head: Normocephalic and atraumatic.  Mouth/Throat: Oropharynx is clear and moist.  No lip or tongue swelling, handling secretions well, normal phonation without stridor  Eyes: Conjunctivae and EOM are normal. Pupils are equal, round, and reactive to light.  Neck: Normal range of motion.  Cardiovascular: Normal rate,  regular rhythm and normal heart sounds.  Pulmonary/Chest: Effort normal and breath sounds normal. She has no wheezes. She has no rhonchi.  Lungs clear  Abdominal: Soft. Bowel sounds are normal.  Musculoskeletal: Normal range of motion.  Neurological: She is alert and oriented to person, place, and time.  Skin: Skin is warm and dry. Rash noted. Rash is urticarial.  Diffuse urticarial rash all over body including race; no signs of superimposed infection or cellulitis  Psychiatric: She has a normal mood and affect.  Nursing note and vitals reviewed.    ED Treatments / Results  Labs (all labs ordered are listed, but only abnormal results are displayed) Labs Reviewed - No data to display  EKG  EKG Interpretation None       Radiology No results found.  Procedures Procedures (including critical care time)  Medications Ordered in ED Medications  famotidine (PEPCID) IVPB 20 mg premix (20 mg Intravenous New Bag/Given 11/25/17 0547)  methylPREDNISolone sodium succinate (SOLU-MEDROL) 125 mg/2 mL injection 125 mg (125 mg Intravenous Given 11/25/17 0542)  diphenhydrAMINE (BENADRYL) injection 25 mg (25 mg Intravenous Given 11/25/17 0545)  sodium chloride 0.9 % bolus 1,000 mL (1,000 mLs Intravenous New Bag/Given 11/25/17 0542)     Initial Impression / Assessment and Plan / ED Course  I have reviewed the triage vital signs and the nursing notes.  Pertinent labs & imaging results that were available during my care of the patient were reviewed by me and considered in my medical decision making (see chart for details).  32 y.o. F here with generalized urticaria.   Likely from new soap with lavender she used 2 days ago at girlfriends house.  Diffuse hives on exam, no complicating features.  Does have facial involvement with mild swelling.  No airway compromise.  Handling secretions well.  In light of facial involvement, will give IV benadryl, pepcid, solu-medrol.  6:24 AM Patient reassessed.  Her  hives have dissipated.  She is no longer itching.  Her airway remains clear.  Facial swelling has improved as well.  At this time, feel she is stable for discharge.  We will continue prednisone taper for a few days given facial involvement.  Can continue Benadryl and Pepcid at home as well.  Close monitoring of symptoms.  She understands to return here for any new or acute changes.  Final Clinical Impressions(s) / ED Diagnoses   Final diagnoses:  Urticaria    ED Discharge Orders        Ordered    predniSONE (DELTASONE) 20 MG tablet     11/25/17 0618       Larene Pickett, PA-C 11/25/17 4034    Orpah Greek, MD 11/25/17 [phone]

## 2017-11-25 NOTE — ED Triage Notes (Signed)
Pt reports hives to entire body starting Thursday evening/ Friday morning. Pt has tried Benadryl with no relief. Pt denies SOB, trouble breathing/swallowing etc.

## 2017-11-27 MED FILL — predniSONE 20 MG TABS: 20 | 9 days supply | Qty: 12 | Fill #0

## 2017-12-26 ENCOUNTER — Encounter (HOSPITAL_BASED_OUTPATIENT_CLINIC_OR_DEPARTMENT_OTHER): Payer: Self-pay

## 2017-12-26 ENCOUNTER — Other Ambulatory Visit: Payer: Self-pay

## 2017-12-26 ENCOUNTER — Emergency Department (HOSPITAL_BASED_OUTPATIENT_CLINIC_OR_DEPARTMENT_OTHER)
Admission: EM | Admit: 2017-12-26 | Discharge: 2017-12-26 | Disposition: A | Discharge status: Home / Self Care | Payer: Self-pay | Attending: Emergency Medicine | Admitting: Emergency Medicine

## 2017-12-26 DIAGNOSIS — J45909 Unspecified asthma, uncomplicated: Secondary | ICD-10-CM | POA: Insufficient documentation

## 2017-12-26 DIAGNOSIS — K0889 Other specified disorders of teeth and supporting structures: Secondary | ICD-10-CM | POA: Insufficient documentation

## 2017-12-26 MED ORDER — KETOROLAC TROMETHAMINE 30 MG/ML IJ SOLN
15.0000 mg | Freq: Once | INTRAMUSCULAR | Status: AC
  Administered 2017-12-26: 15 mg via INTRAMUSCULAR
  Filled 2017-12-26: qty 1

## 2017-12-26 MED ORDER — IBUPROFEN 800 MG PO TABS
800.0000 mg | ORAL_TABLET | Freq: Once | ORAL | Status: AC
  Administered 2017-12-26: 800 mg via ORAL
  Filled 2017-12-26: qty 1

## 2017-12-26 MED ORDER — CLINDAMYCIN HCL 300 MG PO CAPS: 300.0000 mg | ORAL_CAPSULE | Freq: Three times a day (TID) | ORAL | 0 refills | Status: AC

## 2017-12-26 MED FILL — CLINDAMYCIN HCL 300 MG CAPS: 300 | 7 days supply | Qty: 21 | Fill #0

## 2017-12-26 NOTE — ED Provider Notes (Signed)
[facility] EMERGENCY DEPARTMENT Provider Note   CSN: [id number] Arrival date & time: 12/26/17  1433     History   Chief Complaint Chief Complaint  Patient presents with  . Dental Pain    HPI Katie Bauer is a 32 y.o. female.  HPI 32 year old African-American female with no pertinent past medical history presents to the ED with complaints of dental pain.  Patient states that she started to develop pain in her left upper tooth approximately 2 days ago.  She states that she took over-the-counter analgesics with resolution in her pain.  Patient states that she woke up this morning with swelling to the left side of her face.  She also reports pain over her left upper tooth.  Patient denies any difficulties breathing or swallowing.  Patient does not have a dentist.  She reports intermittent fevers and chills.  Nothing makes her symptoms better or worse.  Rates the pain a 3 out of 10. Past Medical History:  Diagnosis Date  . Asthma   . Fibroid     Patient Active Problem List   Diagnosis Date Noted  . 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 TOOTH EXTRACTION      OB History    No data available       Home Medications    Prior to Admission medications   Medication Sig Start Date End Date Taking? Authorizing Provider  clindamycin (CLEOCIN) 300 MG capsule Take 1 capsule (300 mg total) by mouth 3 (three) times daily. 12/26/17   Doristine Devoid, PA-C    Family History Family History  Problem Relation Age of Onset  . Other Neg Hx     Social History Social History   Tobacco Use  . Smoking status: Never Smoker  . Smokeless tobacco: Never Used  Substance Use Topics  . Alcohol use: Yes    Comment: occ  . Drug use: No     Allergies   Patient has no known allergies.   Review of Systems Review of Systems  Constitutional: Positive for chills and fever. Negative for activity change and appetite change.  HENT:  Positive for dental problem and facial swelling. Negative for trouble swallowing.   Respiratory: Negative for shortness of breath.   Gastrointestinal: Negative for vomiting.  Skin: Negative for color change.     Physical Exam Updated Vital Signs BP 137/86 (BP Location: Right Arm)   Pulse 80   Temp 98.9 F (37.2 C) (Oral)   Resp 16   Ht 5\' 6"  (1.676 m)   Wt 115.2 kg (254 lb)   SpO2 100%   BMI 41.00 kg/m   Physical Exam  Constitutional: She appears well-developed and well-nourished. No distress.  HENT:  Head: Normocephalic and atraumatic.  Right Ear: Tympanic membrane, external ear and ear canal normal.  Left Ear: Tympanic membrane, external ear and ear canal normal.  Nose: Nose normal.  Mouth/Throat: Oropharynx is clear and moist and mucous membranes are normal. No trismus in the jaw. Abnormal dentition. No uvula swelling.    Left-sided facial swelling over the left maxillary region.  No sublingual or submandibular swelling.  Managing secretions and tolerating airway.  Speaking in complete sentences.  There is no erythema over the left side of the face.  No warmth to touch.  Eyes: Right eye exhibits no discharge. Left eye exhibits no discharge. No scleral icterus.  Neck: Normal range of motion.  Pulmonary/Chest: No respiratory distress.  Musculoskeletal: Normal range  of motion.  Neurological: She is alert.  Skin: Skin is warm and dry. Capillary refill takes less than 2 seconds. No pallor.  Psychiatric: Her behavior is normal. Judgment and thought content normal.  Nursing note and vitals reviewed.    ED Treatments / Results  Labs (all labs ordered are listed, but only abnormal results are displayed) Labs Reviewed - No data to display  EKG  EKG Interpretation None       Radiology No results found.  Procedures Procedures (including critical care time)  Medications Ordered in ED    Initial Impression / Assessment and Plan / ED Course  I have reviewed the  triage vital signs and the nursing notes.  Pertinent labs & imaging results that were available during my care of the patient were reviewed by me and considered in my medical decision making (see chart for details).     Patient with toothache.  No gross abscess.  Exam unconcerning for Ludwig's angina or spread of infection.  Will treat with clindamycin and pain medicine.  Urged patient to follow-up with dentist.     Final Clinical Impressions(s) / ED Diagnoses   Final diagnoses:  Pain, dental    ED Discharge Orders        Ordered    clindamycin (CLEOCIN) 300 MG capsule  3 times daily     12/26/17 1737       Aaron Edelman 12/26/17 1911    Jola Schmidt, MD 12/26/17 2226

## 2017-12-26 NOTE — Discharge Instructions (Signed)
You have a dental injury/infection. It is very important that you get evaluated by a dentist as soon as possible. Call tomorrow to schedule an appointment. Ibuprofen and tylenol as needed for pain. Take your full course of antibiotics. Read the instructions below.  Eat a soft or liquid diet and rinse your mouth out after meals with warm water. You should see a dentist or return here at once if you have increased swelling, increased pain or uncontrolled bleeding from the site of your injury.  SEEK MEDICAL CARE IF:  You have increased pain not controlled with medicines.  You have swelling around your tooth, in your face or neck.  You have bleeding which starts, continues, or gets worse.  You have a fever >101 If you are unable to open your mouth

## 2017-12-26 NOTE — ED Notes (Signed)
ED Provider at bedside. 

## 2017-12-26 NOTE — ED Triage Notes (Addendum)
C/o left upper toothache, swelling to left cheek x 2 days-NAD-steady gait-last dose motrin 3am

## 2018-02-25 ENCOUNTER — Other Ambulatory Visit: Payer: Self-pay

## 2018-02-25 ENCOUNTER — Emergency Department (HOSPITAL_COMMUNITY): Payer: Self-pay

## 2018-02-25 ENCOUNTER — Emergency Department (HOSPITAL_COMMUNITY)
Admission: EM | Admit: 2018-02-25 | Discharge: 2018-02-25 | Disposition: A | Discharge status: Home / Self Care | Payer: Self-pay | Attending: Emergency Medicine | Admitting: Emergency Medicine

## 2018-02-25 ENCOUNTER — Encounter (HOSPITAL_COMMUNITY): Payer: Self-pay

## 2018-02-25 DIAGNOSIS — J069 Acute upper respiratory infection, unspecified: Secondary | ICD-10-CM | POA: Insufficient documentation

## 2018-02-25 DIAGNOSIS — R0989 Other specified symptoms and signs involving the circulatory and respiratory systems: Secondary | ICD-10-CM | POA: Insufficient documentation

## 2018-02-25 DIAGNOSIS — B9789 Other viral agents as the cause of diseases classified elsewhere: Secondary | ICD-10-CM | POA: Insufficient documentation

## 2018-02-25 DIAGNOSIS — R070 Pain in throat: Secondary | ICD-10-CM | POA: Insufficient documentation

## 2018-02-25 DIAGNOSIS — J45909 Unspecified asthma, uncomplicated: Secondary | ICD-10-CM | POA: Insufficient documentation

## 2018-02-25 DIAGNOSIS — R509 Fever, unspecified: Secondary | ICD-10-CM | POA: Insufficient documentation

## 2018-02-25 DIAGNOSIS — R197 Diarrhea, unspecified: Secondary | ICD-10-CM | POA: Insufficient documentation

## 2018-02-25 DIAGNOSIS — R0981 Nasal congestion: Secondary | ICD-10-CM | POA: Insufficient documentation

## 2018-02-25 DIAGNOSIS — R05 Cough: Secondary | ICD-10-CM | POA: Insufficient documentation

## 2018-02-25 MED ORDER — HYDROCODONE-HOMATROPINE 5-1.5 MG/5ML PO SYRP: 5.0000 mL | ORAL_SOLUTION | Freq: Four times a day (QID) | ORAL | 0 refills | Status: AC | PRN

## 2018-02-25 MED ORDER — ALBUTEROL SULFATE HFA 108 (90 BASE) MCG/ACT IN AERS: 1.0000 | INHALATION_SPRAY | Freq: Four times a day (QID) | RESPIRATORY_TRACT | 0 refills | Status: AC | PRN

## 2018-02-25 NOTE — ED Triage Notes (Signed)
Pt reports intermittent bodyaches, chills X5 days. She reports some fever as well. Pt also has nasal congestion and cough.

## 2018-02-25 NOTE — ED Provider Notes (Signed)
[facility] DEPART[facility] Provider Note   CSN: [id number] Arrival date & time: 02/25/18  1205     History   Chief Complaint Chief Complaint  Patient presents with  . Generalized Body Aches  . Nasal Congestion    HPI Katie Bauer is a 32 y.o. female with childhood asthma, occasional tobacco use, is here for generalized body pain for 5 days, intermittent. Associated with nasal congestion, runny nose, resolved sore throat, diarrhea, fevers, cough with dark yellow sputum. Has been taken Tylenol with mild relief. No aggravating factors. Her boss had pneumonia a week or 2 ago and thinks that she may have gotten a cold from him. Is having diffuse chest wall pain with coughing. Denies exertional shortness of breath, chest pain, nausea, vomiting, wheezing, abdominal pain.  HPI  Past Medical History:  Diagnosis Date  . Asthma   . Fibroid     Patient Active Problem List   Diagnosis Date Noted  . 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 TOOTH EXTRACTION       OB History   None      Home Medications    Prior to Admission medications   Medication Sig Start Date End Date Taking? Authorizing Provider  albuterol (PROVENTIL HFA;VENTOLIN HFA) 108 (90 Base) MCG/ACT inhaler Inhale 1-2 puffs into the lungs every 6 (six) hours as needed for wheezing or shortness of breath. 02/25/18   Kinnie Feil, PA-C  clindamycin (CLEOCIN) 300 MG capsule Take 1 capsule (300 mg total) by mouth 3 (three) times daily. 12/26/17   Doristine Devoid, PA-C  HYDROcodone-homatropine (HYCODAN) 5-1.5 MG/5ML syrup Take 5 mLs by mouth every 6 (six) hours as needed for cough. FOR DISRUPTIVE NIGHT TIME COUGH AND BODY ACHES 02/25/18   Kinnie Feil, PA-C    Family History Family History  Problem Relation Age of Onset  . Other Neg Hx     Social History Social History   Tobacco Use  . Smoking status: Never Smoker  . Smokeless tobacco:  Never Used  Substance Use Topics  . Alcohol use: Yes    Comment: occ  . Drug use: No     Allergies   Patient has no known allergies.   Review of Systems Review of Systems  Constitutional: Positive for fever.  HENT: Positive for congestion and rhinorrhea.   Respiratory: Positive for cough.   Gastrointestinal: Positive for diarrhea.  Musculoskeletal: Positive for myalgias.  All other systems reviewed and are negative.    Physical Exam Updated Vital Signs BP 130/87 (BP Location: Right Arm)   Pulse 75   Temp 98.4 F (36.9 C) (Oral)   Resp 18   Ht 5\' 6"  (1.676 m)   Wt 115.2 kg (254 lb)   LMP 02/19/2018   SpO2 100%   BMI 41.00 kg/m   Physical Exam  Constitutional: She is oriented to person, place, and time. She appears well-developed and well-nourished. No distress.  Non toxic. Sounds congested.  HENT:  Head: Normocephalic and atraumatic.  Nose: Nose normal.  Mouth/Throat: No oropharyngeal exudate.  Moist mucous membranes. Oropharynx and tonsils normal.  Eyes: Pupils are equal, round, and reactive to light. Conjunctivae and EOM are normal.  Neck: Normal range of motion.  Nontender, bilateral submandibular lymph node swelling  Cardiovascular: Normal rate, regular rhythm and intact distal pulses.  No murmur heard. 2+ DP and radial pulses bilaterally. No LE edema.   Pulmonary/Chest: She has rales.  Questionable faint crackles  to anterior, right middle lobe/lower lobe. No tachypnea, hypoxia, increased work of breathing. No wheezing.  Abdominal: Soft. Bowel sounds are normal. There is no tenderness.  No G/R/R. No suprapubic or CVA tenderness.   Musculoskeletal: Normal range of motion. She exhibits no deformity.  Neurological: She is alert and oriented to person, place, and time.  Skin: Skin is warm and dry. Capillary refill takes less than 2 seconds.  Psychiatric: She has a normal mood and affect. Her behavior is normal. Judgment and thought content normal.  Nursing  note and vitals reviewed.    ED Treatments / Results  Labs (all labs ordered are listed, but only abnormal results are displayed) Labs Reviewed - No data to display  EKG None  Radiology Dg Chest 2 View  Result Date: 02/25/2018 CLINICAL DATA:  Cough beginning 2 days ago. Chest pain. Intermittent fever. EXAM: CHEST - 2 VIEW COMPARISON:  07/08/2015 FINDINGS: Heart size is normal. Mediastinal shadows are normal. The lungs are clear. No bronchial thickening. No infiltrate, mass, effusion or collapse. Pulmonary vascularity is normal. No bony abnormality. IMPRESSION: Normal chest Electronically Signed   By: Nelson Chimes M.D.   On: 02/25/2018 15:14    Procedures Procedures (including critical care time)  Medications Ordered in ED Medications - No data to display   Initial Impression / Assessment and Plan / ED Course  I have reviewed the triage vital signs and the nursing notes.  Pertinent labs & imaging results that were available during my care of the patient were reviewed by me and considered in my medical decision making (see chart for details).    32 y.o. -year-old female with ppmh of childhood asthma presents with URI like symptoms  5 days. Known sick contacts. On my exam patient is nontoxic appearing, speaking in full sentences, w/o increased WOB. No fever, tachypnea, tachycardia, hypoxia. Lungs are CTAB. CXR is negative. No significant h/o immunocompromise. Given reassuring physical exam, will discharge with symptomatic treatment. Strict ED return precautions given. Patient is aware that a viral URI infection may precede pneumonia or worsening illness. Patient is aware of red flag symptoms to monitor for that would warrant return to the ED for further reevaluation.    Final Clinical Impressions(s) / ED Diagnoses   Final diagnoses:  Viral URI with cough    ED Discharge Orders        Ordered    HYDROcodone-homatropine (HYCODAN) 5-1.5 MG/5ML syrup  Every 6 hours PRN      02/25/18 1552    albuterol (PROVENTIL HFA;VENTOLIN HFA) 108 (90 Base) MCG/ACT inhaler  Every 6 hours PRN     02/25/18 1552       Kinnie Feil, PA-C 02/25/18 1607    Virgel Manifold, MD 02/26/18 [phone]

## 2018-02-25 NOTE — Discharge Instructions (Addendum)
Your chest x-ray was negative, no pneumonia. Your symptoms are most consistent with a viral upper respiratory infection, possibly influenza.  The main treatment approach for a viral upper respiratory infection is to treat the symptoms, support your immune system and prevent spread of illness. Stay well-hydrated. Rest. Take ibuprofen or Tylenol around the clock to help with associated fevers, sore throat, headaches, generalized body aches and malaise. Use an over-the-counter nasal steroid spray (like Flonase) to help with nasal congestion, runny nose and postnasal drip.  Wash your hands often to prevent spread. I have given you a prescription for albuterol and hycodan which can help with cough. Hycodan has small amount of hydrocodone which is a narcotic pain medication, that has risk of overdose, death, dependence and abuse. Do not consume alcohol, drive or use heavy machinery while taking this medication. Do not leave unattended around children. Flush any remaining medicine that you do not use and do not share.    A viral upper respiratory infection and/or influenza typically lasts 7-10 days.  Symptoms resolve slowly.  However, a viral upper respiratory infection can also worsen and progress into pneumonia.  Monitor your symptoms. If your symptoms worsen, persist and you develop persistent fevers, chest pain, productive cough you should follow up with your primary care provider.

## 2018-02-25 NOTE — ED Notes (Signed)
Pt stable, ambulatory, and verbalizes understanding of d/c instructions.  

## 2018-02-26 MED FILL — HYDROMET SYRUP: 5-1.5 | 6 days supply | Qty: 120 | Fill #0

## 2018-07-25 IMAGING — CR DG CHEST 2V
2 series · 2 of 2 positions shown · non-contrast
Comparison: 07/08/2015

CLINICAL DATA: Cough beginning 2 days ago. Chest pain. Intermittent
fever.

EXAM:
CHEST - 2 VIEW

[chest pa]
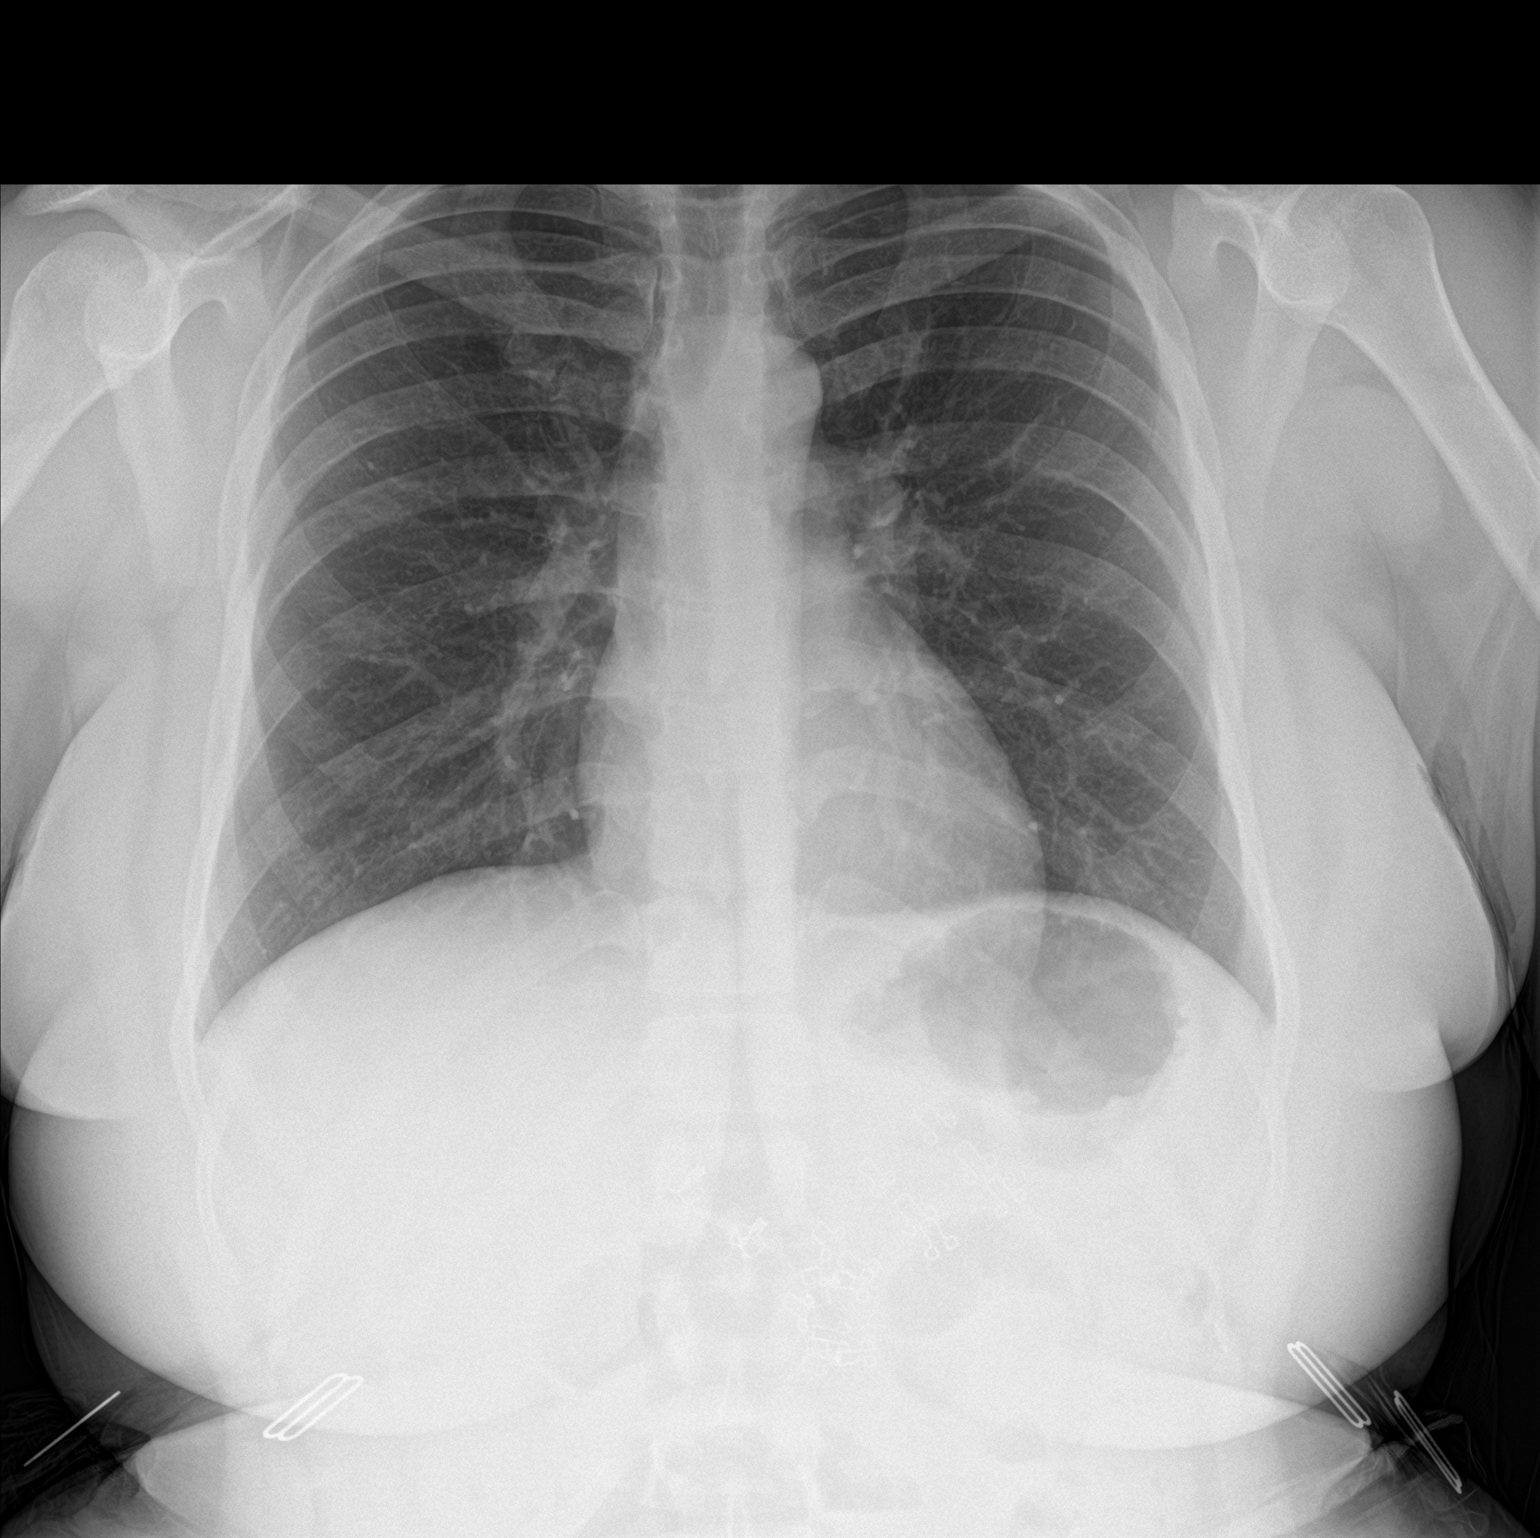

[chest lat]
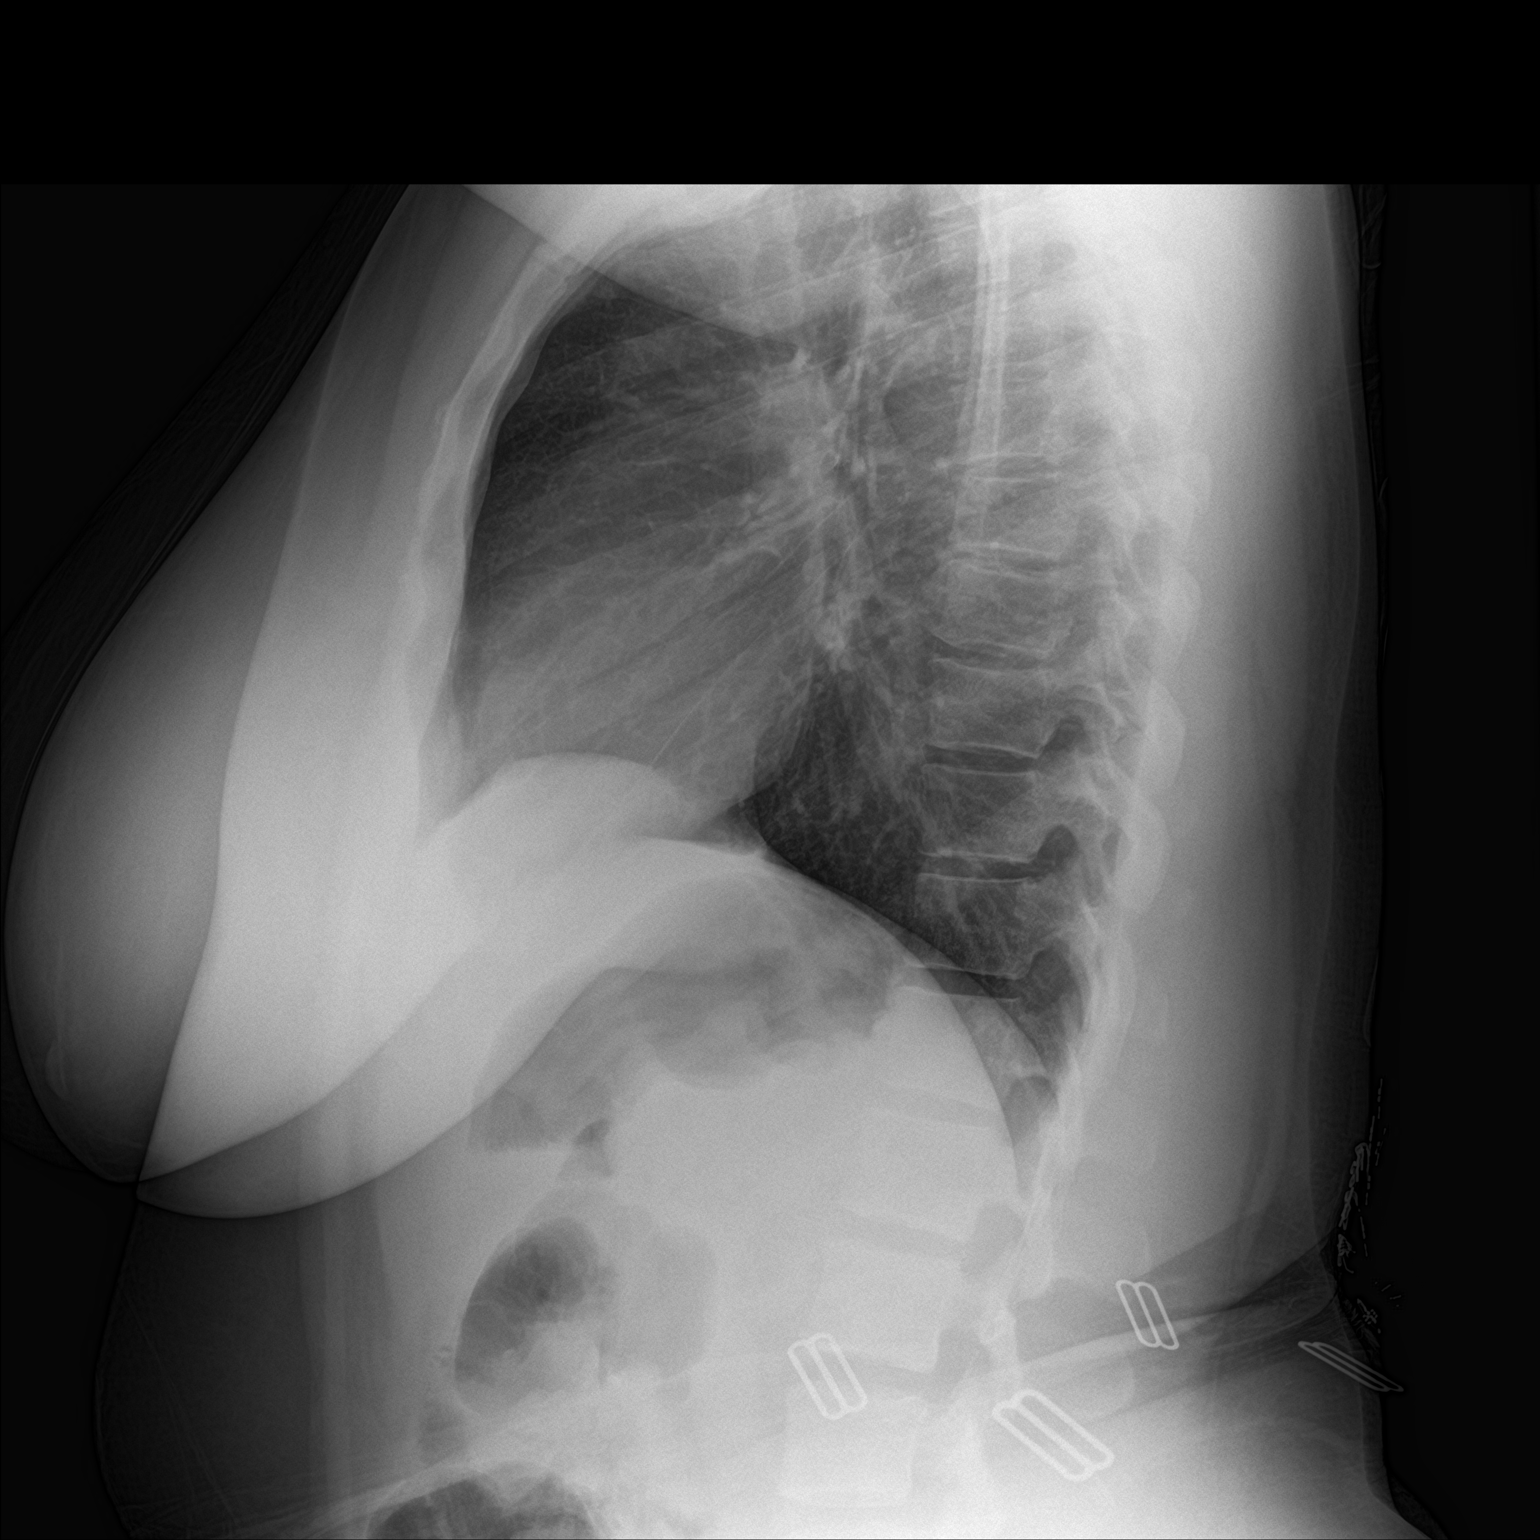

[2 of 2 positions shown; findings below may reference images not displayed]

FINDINGS: Heart size is normal. Mediastinal shadows are normal. The lungs are
clear. No bronchial thickening. No infiltrate, mass, effusion or
collapse. Pulmonary vascularity is normal. No bony abnormality.
IMPRESSION: Normal chest

## 2023-08-08 ENCOUNTER — Encounter (HOSPITAL_COMMUNITY): Payer: Self-pay

## 2023-08-08 ENCOUNTER — Other Ambulatory Visit: Payer: Self-pay

## 2023-08-08 ENCOUNTER — Emergency Department (HOSPITAL_COMMUNITY)
Admission: EM | Admit: 2023-08-08 | Discharge: 2023-08-08 | Disposition: A | Discharge status: Home / Self Care | Payer: 59 | Attending: Emergency Medicine | Admitting: Emergency Medicine

## 2023-08-08 DIAGNOSIS — J069 Acute upper respiratory infection, unspecified: Secondary | ICD-10-CM | POA: Diagnosis not present

## 2023-08-08 DIAGNOSIS — M791 Myalgia, unspecified site: Secondary | ICD-10-CM | POA: Diagnosis present

## 2023-08-08 DIAGNOSIS — Z20822 Contact with and (suspected) exposure to covid-19: Secondary | ICD-10-CM | POA: Diagnosis not present

## 2023-08-08 LAB — RESP PANEL BY RT-PCR (RSV, FLU A&B, COVID)  RVPGX2
Influenza A by PCR: NEGATIVE
Influenza B by PCR: NEGATIVE
Resp Syncytial Virus by PCR: NEGATIVE
SARS Coronavirus 2 by RT PCR: NEGATIVE

## 2023-08-08 NOTE — ED Triage Notes (Signed)
Pt complaining of generalized body aches, nausea, and diarrhea. States s/s began today, and covid/flu has been going around at her job. Pt took an at home covid test yesterday and was negative.

## 2023-08-08 NOTE — ED Notes (Signed)
Pt in hall bed.  Unable to use precautions other than masks at this time

## 2023-08-08 NOTE — ED Provider Notes (Signed)
[place] EMERGENCY DEPARTMENT AT [facility] Provider Note   CSN: [id number] Arrival date & time: 08/08/23  1631     History Chief Complaint  Patient presents with   Generalized Body Aches         Katie Bauer is a 37 y.o. female.  Patient presents the emergency department concern of generalized bodyaches, nausea and diarrhea.  At her work recently tested positive for COVID.  Reports he took a home COVID test this morning that was negative.  Denies any chest pain or shortness of breath.  Denies any significant abdominal pain or urinary symptoms.  Has not managing symptoms at home with over-the-counter medications.  No other acute concerns.  HPI     Home Medications Prior to Admission medications   Medication Sig Start Date End Date Taking? Authorizing Provider  albuterol (PROVENTIL HFA;VENTOLIN HFA) 108 (90 Base) MCG/ACT inhaler Inhale 1-2 puffs into the lungs every 6 (six) hours as needed for wheezing or shortness of breath. 02/25/18   Liberty Handy, PA-C  clindamycin (CLEOCIN) 300 MG capsule Take 1 capsule (300 mg total) by mouth 3 (three) times daily. 12/26/17   Rise Mu, PA-C  HYDROcodone-homatropine (HYCODAN) 5-1.5 MG/5ML syrup Take 5 mLs by mouth every 6 (six) hours as needed for cough. FOR DISRUPTIVE NIGHT TIME COUGH AND BODY ACHES 02/25/18   Liberty Handy, PA-C      Allergies    Patient has no known allergies.    Review of Systems   Review of Systems  HENT:  Positive for congestion.   All other systems reviewed and are negative.   Physical Exam Updated Vital Signs BP (!) 135/91   Pulse 73   Temp 98 F (36.7 C)   Resp 15   Ht 5\' 6"  (1.676 m)   Wt 106.6 kg   LMP 07/27/2023 (Exact Date)   SpO2 100%   BMI 37.93 kg/m  Physical Exam Vitals and nursing note reviewed.  Constitutional:      General: She is not in acute distress.    Appearance: She is well-developed.  HENT:     Head: Normocephalic and atraumatic.  Eyes:      Conjunctiva/sclera: Conjunctivae normal.  Cardiovascular:     Rate and Rhythm: Normal rate and regular rhythm.     Heart sounds: No murmur heard. Pulmonary:     Effort: Pulmonary effort is normal. No respiratory distress.     Breath sounds: Normal breath sounds.  Abdominal:     Palpations: Abdomen is soft.     Tenderness: There is no abdominal tenderness.  Musculoskeletal:        General: No swelling.     Cervical back: Neck supple.  Skin:    General: Skin is warm and dry.     Capillary Refill: Capillary refill takes less than 2 seconds.  Neurological:     Mental Status: She is alert.  Psychiatric:        Mood and Affect: Mood normal.     ED Results / Procedures / Treatments   Labs (all labs ordered are listed, but only abnormal results are displayed) Labs Reviewed  RESP PANEL BY RT-PCR (RSV, FLU A&B, COVID)  RVPGX2    EKG None  Radiology No results found.  Procedures Procedures   Medications Ordered in ED Medications - No data to display  ED Course/ Medical Decision Making/ A&P  Medical Decision Making  This patient presents to the ED for concern of generalized bodyaches.  Differential diagnosis includes COVID-19, influenza, pneumonia, bronchitis    Lab Tests:  I Ordered, and personally interpreted labs.  The pertinent results include: Negative respiratory viral panel including influenza, COVID-19, RSV    Problem List / ED Course:  Patient presents to the emergency department concerns of generalized bodyaches.  Reports has been ongoing for the last day or so.  States that she has had multiple sick contacts at work with people of tested positive for COVID.  Patient tested self for COVID at home with a negative home test.  Denies any chest pain shortness of breath at this time.  Respiratory viral panel ordered from triage for assessment of symptoms. Respiratory viral panel is negative.  Physical exam is unremarkable without any  signs of any acute lung findings suggestive of pneumonia or bronchitis.  Given lack of other constitutional symptoms, low concern at this time for GI viral process.  Doubt bronchitis, pneumonia, PE.  Advised management with over-the-counter medications for symptoms.  Discussed strict return precautions with patient.  No acute concerns.  All questions answered prior to patient discharge.  Patient discharged home in stable condition.  Final Clinical Impression(s) / ED Diagnoses Final diagnoses:  Viral upper respiratory tract infection    Rx / DC Orders ED Discharge Orders     None         Salomon Mast 08/08/23 2027    Tegeler, Canary Brim, MD 08/08/23 2313

## 2023-08-08 NOTE — Discharge Instructions (Signed)
You are seen in the emergency department for body aches.  Your respiratory viral panel was negative for any acute findings.  You likely self contracted some sort of virus given.  Exposure to multiple individuals have been sick recently.  I would advise managing symptoms with over-the-counter medications for cough, fever, body aches.  If you have any worsening or symptoms or new symptoms arise, return to the emergency department for reevaluation or follow-up with your primary care provider.

## 2024-01-03 ENCOUNTER — Emergency Department (HOSPITAL_COMMUNITY)
Admission: EM | Admit: 2024-01-03 | Discharge: 2024-01-03 | Disposition: A | Discharge status: Home / Self Care | Payer: BC Managed Care – PPO | Attending: Emergency Medicine | Admitting: Emergency Medicine

## 2024-01-03 ENCOUNTER — Emergency Department (HOSPITAL_COMMUNITY): Payer: BC Managed Care – PPO

## 2024-01-03 DIAGNOSIS — J45909 Unspecified asthma, uncomplicated: Secondary | ICD-10-CM | POA: Diagnosis not present

## 2024-01-03 DIAGNOSIS — J40 Bronchitis, not specified as acute or chronic: Secondary | ICD-10-CM | POA: Insufficient documentation

## 2024-01-03 DIAGNOSIS — R059 Cough, unspecified: Secondary | ICD-10-CM | POA: Diagnosis present

## 2024-01-03 DIAGNOSIS — Z20822 Contact with and (suspected) exposure to covid-19: Secondary | ICD-10-CM | POA: Insufficient documentation

## 2024-01-03 LAB — RESP PANEL BY RT-PCR (RSV, FLU A&B, COVID)  RVPGX2
Influenza A by PCR: NEGATIVE
Influenza B by PCR: NEGATIVE
Resp Syncytial Virus by PCR: POSITIVE — AB
SARS Coronavirus 2 by RT PCR: NEGATIVE

## 2024-01-03 MED ORDER — ONDANSETRON 4 MG PO TBDP
4.0000 mg | ORAL_TABLET | Freq: Once | ORAL | Status: AC
  Administered 2024-01-03: 4 mg via ORAL
  Filled 2024-01-03: qty 1

## 2024-01-03 MED ORDER — DEXTROMETHORPHAN-GUAIFENESIN 10-100 MG/5ML PO LIQD
5.0000 mL | ORAL | 0 refills | Status: DC | PRN
  Filled 2024-01-03: qty 118, 4d supply, fill #0

## 2024-01-03 MED ORDER — PREDNISONE 10 MG (21) PO TBPK: ORAL_TABLET | Freq: Every day | ORAL | 0 refills | Status: AC

## 2024-01-03 MED ORDER — ALBUTEROL SULFATE HFA 108 (90 BASE) MCG/ACT IN AERS
2.0000 | INHALATION_SPRAY | Freq: Once | RESPIRATORY_TRACT | Status: AC
  Administered 2024-01-03: 2 via RESPIRATORY_TRACT
  Filled 2024-01-03: qty 6.7

## 2024-01-03 MED ORDER — PREDNISONE 20 MG PO TABS
60.0000 mg | ORAL_TABLET | Freq: Once | ORAL | Status: AC
  Administered 2024-01-03: 60 mg via ORAL
  Filled 2024-01-03: qty 3

## 2024-01-03 MED ORDER — ONDANSETRON 4 MG PO TBDP: 4.0000 mg | ORAL_TABLET | Freq: Three times a day (TID) | ORAL | 0 refills | Status: AC | PRN

## 2024-01-03 MED ORDER — ONDANSETRON 4 MG PO TBDP
4.0000 mg | ORAL_TABLET | Freq: Three times a day (TID) | ORAL | 0 refills | Status: DC | PRN
  Filled 2024-01-03: qty 20, 7d supply, fill #0

## 2024-01-03 MED ORDER — DEXTROMETHORPHAN-GUAIFENESIN 10-100 MG/5ML PO LIQD: 5.0000 mL | ORAL | 0 refills | Status: AC | PRN

## 2024-01-03 MED ORDER — PREDNISONE 10 MG (21) PO TBPK
ORAL_TABLET | Freq: Every day | ORAL | 0 refills | Status: DC
  Filled 2024-01-03: qty 42, fill #0

## 2024-01-03 NOTE — ED Provider Triage Note (Signed)
Emergency Medicine Provider Triage Evaluation Note  Lorain Fettes , a 38 y.o. female  was evaluated in triage.  Pt complains of generalized bodyaches for the past 4 days.  Also reports some mild cough, shortness of breath and nausea.  Denies any chest pain.  She has been trying over-the-counter cold and flu medications without significant relief.  Review of Systems  Positive: As above Negative: As above  Physical Exam  BP (!) 159/91 (BP Location: Right Arm)   Pulse (!) 103   Temp 98.9 F (37.2 C) (Oral)   Resp 16   SpO2 98%  Gen:   Awake, no distress   Resp:  Normal effort, talking in full sentences on room air MSK:   Moves extremities without difficulty    Medical Decision Making  Medically screening exam initiated at 6:57 PM.  Appropriate orders placed.  Dustine Bertini was informed that the remainder of the evaluation will be completed by another provider, this initial triage assessment does not replace that evaluation, and the importance of remaining in the ED until their evaluation is complete.     Arabella Merles, PA-C 01/03/24 1859

## 2024-01-03 NOTE — ED Provider Notes (Signed)
 [place] EMERGENCY DEPARTMENT AT [facility] Provider Note   CSN: [id number] Arrival date & time: 01/03/24  1845    History  Chief Complaint  Patient presents with   flu like symptoms    Katie Bauer is a 38 y.o. female here for evaluation of feeling unwell. Chills, cough, wheeze, nausea, myalgias.  Remote history of asthma however typically only has issues during respiratory infections.  Possible sick contacts.  Generalized weakness however no focal weakness.  Shortness of breath with coughing spells. No LE swelling.  HPI     Home Medications Prior to Admission medications   Medication Sig Start Date End Date Taking? Authorizing Provider  dextromethorphan-guaiFENesin (ROBITUSSIN-DM) 10-100 MG/5ML liquid Take 5 mLs by mouth every 4 (four) hours as needed for cough. 01/03/24  Yes Phyliss Hulick A, PA-C  ondansetron (ZOFRAN-ODT) 4 MG disintegrating tablet Take 1 tablet (4 mg total) by mouth every 8 (eight) hours as needed. 01/03/24  Yes Evann Erazo A, PA-C  predniSONE (STERAPRED UNI-PAK 21 TAB) 10 MG (21) TBPK tablet Take by mouth daily. Take 6 tabs by mouth daily  for 1 days, then 5 tabs for 1 days, then 4 tabs for 1 days, then 3 tabs for 1 days, 2 tabs for 1 days, then 1 tab by mouth daily for 1 days 01/03/24  Yes Shahram Alexopoulos A, PA-C  albuterol (PROVENTIL HFA;VENTOLIN HFA) 108 (90 Base) MCG/ACT inhaler Inhale 1-2 puffs into the lungs every 6 (six) hours as needed for wheezing or shortness of breath. 02/25/18   Liberty Handy, PA-C  clindamycin (CLEOCIN) 300 MG capsule Take 1 capsule (300 mg total) by mouth 3 (three) times daily. 12/26/17   Rise Mu, PA-C  HYDROcodone-homatropine (HYCODAN) 5-1.5 MG/5ML syrup Take 5 mLs by mouth every 6 (six) hours as needed for cough. FOR DISRUPTIVE NIGHT TIME COUGH AND BODY ACHES 02/25/18   Liberty Handy, PA-C      Allergies    Patient has no known allergies.    Review of Systems   Review of Systems   Constitutional:  Positive for activity change, appetite change, chills and fever.  HENT:  Positive for congestion, postnasal drip and rhinorrhea. Negative for sore throat, trouble swallowing and voice change.   Respiratory:  Positive for cough and wheezing.   Cardiovascular: Negative.   Gastrointestinal:  Positive for nausea. Negative for abdominal distention, abdominal pain, constipation, diarrhea and vomiting.  Genitourinary: Negative.   Musculoskeletal:  Positive for myalgias.  Skin: Negative.   Neurological:  Positive for weakness (generalized).  All other systems reviewed and are negative.   Physical Exam Updated Vital Signs BP (!) 159/91 (BP Location: Right Arm)   Pulse (!) 103   Temp 98.9 F (37.2 C) (Oral)   Resp 16   LMP 01/02/2024   SpO2 98%  Physical Exam Vitals and nursing note reviewed.  Constitutional:      General: She is not in acute distress.    Appearance: She is well-developed. She is not ill-appearing, toxic-appearing or diaphoretic.  HENT:     Head: Normocephalic and atraumatic.     Nose: Congestion and rhinorrhea present.     Mouth/Throat:     Comments: PO clear Eyes:     Pupils: Pupils are equal, round, and reactive to light.  Cardiovascular:     Rate and Rhythm: Normal rate.     Pulses: Normal pulses.     Heart sounds: Normal heart sounds.  Pulmonary:     Effort: Pulmonary effort is  normal. No respiratory distress.     Breath sounds: Wheezing present.  Abdominal:     General: Bowel sounds are normal. There is no distension.     Palpations: Abdomen is soft.     Tenderness: There is no abdominal tenderness. There is no right CVA tenderness or left CVA tenderness.  Musculoskeletal:        General: No swelling, tenderness, deformity or signs of injury. Normal range of motion.     Cervical back: Normal range of motion.     Right lower leg: No edema.     Left lower leg: No edema.  Skin:    General: Skin is warm and dry.     Capillary Refill:  Capillary refill takes less than 2 seconds.  Neurological:     General: No focal deficit present.     Mental Status: She is alert.     Cranial Nerves: No cranial nerve deficit.     Motor: No weakness.     Gait: Gait normal.  Psychiatric:        Mood and Affect: Mood normal.     ED Results / Procedures / Treatments   Labs (all labs ordered are listed, but only abnormal results are displayed) Labs Reviewed  RESP PANEL BY RT-PCR (RSV, FLU A&B, COVID)  RVPGX2    EKG None  Radiology DG Chest 2 View Result Date: 01/03/2024 CLINICAL DATA:  Flu-like symptoms for the past few days. Shortness of breath. EXAM: CHEST - 2 VIEW COMPARISON:  Chest x-ray dated February 25, 2018. FINDINGS: The heart size and mediastinal contours are within normal limits. Both lungs are clear. The visualized skeletal structures are unremarkable. IMPRESSION: No active cardiopulmonary disease. Electronically Signed   By: Obie Dredge M.D.   On: 01/03/2024 20:39    Procedures Procedures    Medications Ordered in ED Medications  ondansetron (ZOFRAN-ODT) disintegrating tablet 4 mg (has no administration in time range)  predniSONE (DELTASONE) tablet 60 mg (has no administration in time range)  albuterol (VENTOLIN HFA) 108 (90 Base) MCG/ACT inhaler 2 puff (has no administration in time range)    ED Course/ Medical Decision Making/ A&P   38 year old here for evaluation of URI symptoms.  She is afebrile, nonseptic, non-ill-appearing.  She does have some minimal expiratory wheeze.  She appears clinically well-hydrated.  She has no neck stiffness or neck rigidity.  She has some congestion and rhinorrhea.  Her PO is  clear.  Suspect she likely has a viral illness.  She does have a history of asthma and has expiratory wheeze.  Will start her on course of steroids, albuterol.  Some Zofran as needed for nausea.  She will follow-up on her viral panel results on MyChart.  Chest x-ray obtained from triage personally viewed  interpreted No infiltrates, cardiomegaly, pulm edema, pneumothorax  Discussed with patient, friend in room.  Will have follow-up outpatient, return for any worsening symptoms.  The patient has been appropriately medically screened and/or stabilized in the ED. I have low suspicion for any other emergent medical condition which would require further screening, evaluation or treatment in the ED or require inpatient management.  Patient is hemodynamically stable and in no acute distress.  Patient able to ambulate in department prior to ED.  Evaluation does not show acute pathology that would require ongoing or additional emergent interventions while in the emergency department or further inpatient treatment.  I have discussed the diagnosis with the patient and answered all questions.  Pain is been managed while  in the emergency department and patient has no further complaints prior to discharge.  Patient is comfortable with plan discussed in room and is stable for discharge at this time.  I have discussed strict return precautions for returning to the emergency department.  Patient was encouraged to follow-up with PCP/specialist refer to at discharge.                                 Medical Decision Making Amount and/or Complexity of Data Reviewed Independent Historian: friend External Data Reviewed: labs, radiology and notes. Labs: ordered. Decision-making details documented in ED Course. Radiology: ordered and independent interpretation performed. Decision-making details documented in ED Course.  Risk OTC drugs. Prescription drug management. Decision regarding hospitalization. Diagnosis or treatment significantly limited by social determinants of health.           Final Clinical Impression(s) / ED Diagnoses Final diagnoses:  Bronchitis    Rx / DC Orders ED Discharge Orders          Ordered    predniSONE (STERAPRED UNI-PAK 21 TAB) 10 MG (21) TBPK tablet  Daily        01/03/24  2046    ondansetron (ZOFRAN-ODT) 4 MG disintegrating tablet  Every 8 hours PRN        01/03/24 2046    dextromethorphan-guaiFENesin (ROBITUSSIN-DM) 10-100 MG/5ML liquid  Every 4 hours PRN        01/03/24 2046              Kearia Yin A, PA-C 01/03/24 2055    Linwood Dibbles, MD 01/08/24 [phone]

## 2024-01-03 NOTE — Discharge Instructions (Addendum)
I written you for a few medications to help with your symptoms  Prednisone, butyryl this will help with your breathing Zofran as needed for nausea or vomiting Robitussin as needed for cough  Follow up with viral results on Mychart.  You did not have an account, you may set one up based off of the back page of your discharge instructions  FU outpatient, return for new or worsening symptoms

## 2024-01-03 NOTE — ED Triage Notes (Signed)
Pt arrived reporting flu symptoms for a few days. Denies any other concerns

## 2024-01-04 ENCOUNTER — Other Ambulatory Visit (HOSPITAL_BASED_OUTPATIENT_CLINIC_OR_DEPARTMENT_OTHER): Payer: Self-pay

## 2024-01-04 ENCOUNTER — Telehealth (HOSPITAL_COMMUNITY): Payer: Self-pay | Admitting: Emergency Medicine

## 2024-01-04 NOTE — Telephone Encounter (Cosign Needed)
Received call specifying quantity for prednisone prescription. Written as 21 tab prednisone uni-pak but quantity written as 42 tablets. Pharmacist wanted to clarify, they will change to the 21 tablet pack.
